# Patient Record
Sex: Male | Born: 1950 | Race: White | Hispanic: No | Marital: Married | Smoking: Former smoker
Health system: Southern US, Community
[De-identification: ages and names within clinical notes are randomized; demographics above are authoritative.]

## PROBLEM LIST (undated history)

## (undated) DIAGNOSIS — I499 Cardiac arrhythmia, unspecified: Secondary | ICD-10-CM

## (undated) DIAGNOSIS — N400 Enlarged prostate without lower urinary tract symptoms: Secondary | ICD-10-CM

## (undated) DIAGNOSIS — E785 Hyperlipidemia, unspecified: Secondary | ICD-10-CM

## (undated) DIAGNOSIS — N183 Chronic kidney disease, stage 3 unspecified: Secondary | ICD-10-CM

## (undated) DIAGNOSIS — Z86010 Personal history of colonic polyps: Secondary | ICD-10-CM

## (undated) DIAGNOSIS — I10 Essential (primary) hypertension: Secondary | ICD-10-CM

## (undated) HISTORY — PX: TONSILLECTOMY AND ADENOIDECTOMY: SUR1326

## (undated) HISTORY — PX: COLONOSCOPY: SHX174

---

## 1998-08-10 ENCOUNTER — Encounter: Admission: RE | Admit: 1998-08-10 | Discharge: 1998-08-10 | Payer: Self-pay | Admitting: *Deleted

## 2007-07-12 ENCOUNTER — Ambulatory Visit: Payer: Self-pay | Admitting: Gastroenterology

## 2010-03-24 ENCOUNTER — Emergency Department: Payer: Self-pay | Admitting: Emergency Medicine

## 2010-08-12 ENCOUNTER — Ambulatory Visit: Payer: Self-pay | Admitting: Gastroenterology

## 2019-05-29 ENCOUNTER — Inpatient Hospital Stay
Admission: EM | Admit: 2019-05-29 | Discharge: 2019-05-31 | DRG: 193 | Disposition: A | Discharge status: Home Health | Payer: Medicare Other | Attending: Internal Medicine | Admitting: Internal Medicine

## 2019-05-29 ENCOUNTER — Encounter: Payer: Self-pay | Admitting: Emergency Medicine

## 2019-05-29 ENCOUNTER — Emergency Department: Payer: Medicare Other

## 2019-05-29 ENCOUNTER — Inpatient Hospital Stay: Payer: Medicare Other

## 2019-05-29 ENCOUNTER — Other Ambulatory Visit: Payer: Self-pay

## 2019-05-29 DIAGNOSIS — Z87891 Personal history of nicotine dependence: Secondary | ICD-10-CM | POA: Diagnosis not present

## 2019-05-29 DIAGNOSIS — N400 Enlarged prostate without lower urinary tract symptoms: Secondary | ICD-10-CM | POA: Diagnosis present

## 2019-05-29 DIAGNOSIS — E876 Hypokalemia: Secondary | ICD-10-CM | POA: Diagnosis present

## 2019-05-29 DIAGNOSIS — N179 Acute kidney failure, unspecified: Secondary | ICD-10-CM | POA: Diagnosis present

## 2019-05-29 DIAGNOSIS — I248 Other forms of acute ischemic heart disease: Secondary | ICD-10-CM | POA: Diagnosis present

## 2019-05-29 DIAGNOSIS — E785 Hyperlipidemia, unspecified: Secondary | ICD-10-CM | POA: Diagnosis present

## 2019-05-29 DIAGNOSIS — I4891 Unspecified atrial fibrillation: Secondary | ICD-10-CM | POA: Diagnosis not present

## 2019-05-29 DIAGNOSIS — J181 Lobar pneumonia, unspecified organism: Principal | ICD-10-CM | POA: Diagnosis present

## 2019-05-29 DIAGNOSIS — J9601 Acute respiratory failure with hypoxia: Secondary | ICD-10-CM | POA: Diagnosis present

## 2019-05-29 DIAGNOSIS — I480 Paroxysmal atrial fibrillation: Secondary | ICD-10-CM | POA: Diagnosis not present

## 2019-05-29 DIAGNOSIS — R000 Tachycardia, unspecified: Secondary | ICD-10-CM

## 2019-05-29 DIAGNOSIS — I700 Atherosclerosis of aorta: Secondary | ICD-10-CM | POA: Diagnosis present

## 2019-05-29 DIAGNOSIS — Z79899 Other long term (current) drug therapy: Secondary | ICD-10-CM

## 2019-05-29 DIAGNOSIS — R0602 Shortness of breath: Secondary | ICD-10-CM | POA: Diagnosis not present

## 2019-05-29 DIAGNOSIS — Z7901 Long term (current) use of anticoagulants: Secondary | ICD-10-CM | POA: Diagnosis not present

## 2019-05-29 DIAGNOSIS — Z20828 Contact with and (suspected) exposure to other viral communicable diseases: Secondary | ICD-10-CM | POA: Diagnosis present

## 2019-05-29 DIAGNOSIS — Z8249 Family history of ischemic heart disease and other diseases of the circulatory system: Secondary | ICD-10-CM | POA: Diagnosis not present

## 2019-05-29 DIAGNOSIS — N183 Chronic kidney disease, stage 3 (moderate): Secondary | ICD-10-CM | POA: Diagnosis present

## 2019-05-29 DIAGNOSIS — J189 Pneumonia, unspecified organism: Secondary | ICD-10-CM

## 2019-05-29 DIAGNOSIS — I2510 Atherosclerotic heart disease of native coronary artery without angina pectoris: Secondary | ICD-10-CM | POA: Diagnosis present

## 2019-05-29 DIAGNOSIS — I129 Hypertensive chronic kidney disease with stage 1 through stage 4 chronic kidney disease, or unspecified chronic kidney disease: Secondary | ICD-10-CM | POA: Diagnosis present

## 2019-05-29 HISTORY — DX: Essential (primary) hypertension: I10

## 2019-05-29 HISTORY — DX: Chronic kidney disease, stage 3 unspecified: N18.30

## 2019-05-29 HISTORY — DX: Hyperlipidemia, unspecified: E78.5

## 2019-05-29 HISTORY — DX: Benign prostatic hyperplasia without lower urinary tract symptoms: N40.0

## 2019-05-29 LAB — COMPREHENSIVE METABOLIC PANEL
ALT: 61 U/L — ABNORMAL HIGH (ref 0–44)
AST: 67 U/L — ABNORMAL HIGH (ref 15–41)
Albumin: 3 g/dL — ABNORMAL LOW (ref 3.5–5.0)
Alkaline Phosphatase: 117 U/L (ref 38–126)
Anion gap: 11 (ref 5–15)
BUN: 27 mg/dL — ABNORMAL HIGH (ref 8–23)
CO2: 22 mmol/L (ref 22–32)
Calcium: 8.9 mg/dL (ref 8.9–10.3)
Chloride: 103 mmol/L (ref 98–111)
Creatinine, Ser: 1.79 mg/dL — ABNORMAL HIGH (ref 0.61–1.24)
GFR calc Af Amer: 44 mL/min — ABNORMAL LOW (ref >60)
GFR calc non Af Amer: 38 mL/min — ABNORMAL LOW (ref >60)
Glucose, Bld: 108 mg/dL — ABNORMAL HIGH (ref 70–99)
Potassium: 3.6 mmol/L (ref 3.5–5.1)
Sodium: 136 mmol/L (ref 135–145)
Total Bilirubin: 1.2 mg/dL (ref 0.3–1.2)
Total Protein: 7.5 g/dL (ref 6.5–8.1)

## 2019-05-29 LAB — CBC
HCT: 36.4 % — ABNORMAL LOW (ref 39.0–52.0)
Hemoglobin: 12.5 g/dL — ABNORMAL LOW (ref 13.0–17.0)
MCH: 30 pg (ref 26.0–34.0)
MCHC: 34.3 g/dL (ref 30.0–36.0)
MCV: 87.5 fL (ref 80.0–100.0)
Platelets: 249 10*3/uL (ref 150–400)
RBC: 4.16 MIL/uL — ABNORMAL LOW (ref 4.22–5.81)
RDW: 13.5 % (ref 11.5–15.5)
WBC: 9.7 10*3/uL (ref 4.0–10.5)
nRBC: 0 % (ref 0.0–0.2)

## 2019-05-29 LAB — LACTIC ACID, PLASMA
Lactic Acid, Venous: 1.7 mmol/L (ref 0.5–1.9)
Lactic Acid, Venous: 3.1 mmol/L (ref 0.5–1.9)
Lactic Acid, Venous: 1.4 mmol/L (ref 0.5–1.9)
Lactic Acid, Venous: 1.9 mmol/L (ref 0.5–1.9)

## 2019-05-29 LAB — PROCALCITONIN: Procalcitonin: 3.03 ng/mL

## 2019-05-29 LAB — PROTIME-INR
INR: 1.4 — ABNORMAL HIGH (ref 0.8–1.2)
Prothrombin Time: 16.5 seconds — ABNORMAL HIGH (ref 11.4–15.2)

## 2019-05-29 LAB — TSH: TSH: 2.045 u[IU]/mL (ref 0.350–4.500)

## 2019-05-29 LAB — MRSA PCR SCREENING: MRSA by PCR: NEGATIVE

## 2019-05-29 LAB — SARS CORONAVIRUS 2 BY RT PCR (HOSPITAL ORDER, PERFORMED IN ~~LOC~~ HOSPITAL LAB): SARS Coronavirus 2: NEGATIVE

## 2019-05-29 MED ORDER — GUAIFENESIN ER 600 MG PO TB12
600.0000 mg | ORAL_TABLET | Freq: Two times a day (BID) | ORAL | Status: DC
  Administered 2019-05-29 – 2019-05-31 (×5): 600 mg via ORAL
  Filled 2019-05-29 (×5): qty 1

## 2019-05-29 MED ORDER — ONDANSETRON HCL 4 MG PO TABS: 4.0000 mg | ORAL_TABLET | Freq: Four times a day (QID) | ORAL | Status: DC | PRN

## 2019-05-29 MED ORDER — PRAVASTATIN SODIUM 40 MG PO TABS
40.0000 mg | ORAL_TABLET | Freq: Every evening | ORAL | Status: DC
  Filled 2019-05-29: qty 2

## 2019-05-29 MED ORDER — ONDANSETRON HCL 4 MG/2ML IJ SOLN: 4.0000 mg | Freq: Four times a day (QID) | INTRAMUSCULAR | Status: DC | PRN

## 2019-05-29 MED ORDER — SODIUM CHLORIDE 0.9 % IV SOLN
INTRAVENOUS | Status: DC
  Administered 2019-05-29 – 2019-05-30 (×3): via INTRAVENOUS

## 2019-05-29 MED ORDER — POLYETHYLENE GLYCOL 3350 17 G PO PACK: 17.0000 g | PACK | Freq: Every day | ORAL | Status: DC | PRN

## 2019-05-29 MED ORDER — DILTIAZEM HCL-DEXTROSE 100-5 MG/100ML-% IV SOLN (PREMIX)
5.0000 mg/h | INTRAVENOUS | Status: DC
  Administered 2019-05-30: 5 mg/h via INTRAVENOUS
  Filled 2019-05-29 (×2): qty 100

## 2019-05-29 MED ORDER — DILTIAZEM HCL-DEXTROSE 100-5 MG/100ML-% IV SOLN (PREMIX)
5.0000 mg/h | INTRAVENOUS | Status: DC
  Filled 2019-05-29: qty 100

## 2019-05-29 MED ORDER — ACETAMINOPHEN 325 MG PO TABS
650.0000 mg | ORAL_TABLET | Freq: Four times a day (QID) | ORAL | Status: DC | PRN
  Administered 2019-05-29 (×2): 650 mg via ORAL
  Filled 2019-05-29 (×2): qty 2

## 2019-05-29 MED ORDER — LEVOFLOXACIN IN D5W 750 MG/150ML IV SOLN
750.0000 mg | Freq: Once | INTRAVENOUS | Status: AC
  Administered 2019-05-29: 750 mg via INTRAVENOUS
  Filled 2019-05-29: qty 150

## 2019-05-29 MED ORDER — METOPROLOL TARTRATE 5 MG/5ML IV SOLN
INTRAVENOUS | Status: AC
  Administered 2019-05-29: 5 mg via INTRAVENOUS
  Filled 2019-05-29: qty 5

## 2019-05-29 MED ORDER — DOCUSATE SODIUM 100 MG PO CAPS
100.0000 mg | ORAL_CAPSULE | Freq: Two times a day (BID) | ORAL | Status: DC
  Administered 2019-05-29 – 2019-05-30 (×3): 100 mg via ORAL
  Filled 2019-05-29 (×5): qty 1

## 2019-05-29 MED ORDER — TAMSULOSIN HCL 0.4 MG PO CAPS
0.4000 mg | ORAL_CAPSULE | Freq: Every day | ORAL | Status: DC
  Administered 2019-05-29 – 2019-05-31 (×3): 0.4 mg via ORAL
  Filled 2019-05-29 (×3): qty 1

## 2019-05-29 MED ORDER — METOPROLOL TARTRATE 5 MG/5ML IV SOLN
5.0000 mg | INTRAVENOUS | Status: AC
  Administered 2019-05-29: 5 mg via INTRAVENOUS

## 2019-05-29 MED ORDER — ACETAMINOPHEN 650 MG RE SUPP: 650.0000 mg | Freq: Four times a day (QID) | RECTAL | Status: DC | PRN

## 2019-05-29 MED ORDER — SODIUM CHLORIDE 0.9 % IV BOLUS
1000.0000 mL | Freq: Once | INTRAVENOUS | Status: AC
  Administered 2019-05-29: 1000 mL via INTRAVENOUS

## 2019-05-29 MED ORDER — SODIUM CHLORIDE 0.9 % IV SOLN
2.0000 g | Freq: Two times a day (BID) | INTRAVENOUS | Status: DC
  Administered 2019-05-29 – 2019-05-31 (×4): 2 g via INTRAVENOUS
  Filled 2019-05-29 (×6): qty 2

## 2019-05-29 MED ORDER — ENOXAPARIN SODIUM 40 MG/0.4ML ~~LOC~~ SOLN
40.0000 mg | SUBCUTANEOUS | Status: DC
  Administered 2019-05-29: 40 mg via SUBCUTANEOUS
  Filled 2019-05-29: qty 0.4

## 2019-05-29 NOTE — Consult Note (Addendum)
Pharmacy Antibiotic Note  Travis Franklin is a 68 y.o. male admitted on 05/29/2019 with pneumonia.  Pharmacy has been consulted for cefepime dosing. He presents to hospital secondary to worsening shortness of breath over the last 2 to 3 days in spite of Augmentin. His Scr (1.79 mg/dL) is elevated slightly above his baseline of approximately 1.5 mg/dL  Plan:  Start 2 grams IV cefepime every 12 hours   Height: 5\' 10"  (177.8 cm) Weight: 176 lb 1.6 oz (79.9 kg) IBW/kg (Calculated) : 73  Temp (24hrs), Avg:98.8 F (37.1 C), Min:98.1 F (36.7 C), Max:99.4 F (37.4 C)  Recent Labs  Lab 05/29/19 0850 05/29/19 1015 05/29/19 1315  WBC 9.7  --   --   CREATININE 1.79*  --   --   LATICACIDVEN  --  1.7 3.1*    Estimated Creatinine Clearance: 41.3 mL/min (A) (by C-G formula based on SCr of 1.79 mg/dL (H)).    No Known Allergies  Antimicrobials this admission:  cefepime 7/5 >>  levofloxacin 7/5 x1  Microbiology results: 7/5 BCx: pending 7/5 MRSA PCR: negative  Thank you for allowing pharmacy to be a part of this patient's care.  Dallie Piles, PharmD 05/29/2019 2:27 PM

## 2019-05-29 NOTE — H&P (Signed)
[facility] -  at [facility]   PATIENT NAME: Travis ProudJoseph Franklin    MR#:  [id number]  DATE OF BIRTH:  12-Dec-1950  DATE OF ADMISSION:  05/29/2019  PRIMARY CARE PHYSICIAN: Danella PentonMiller, Mark F, MD   REQUESTING/REFERRING PHYSICIAN: Dr. Sharyn CreamerMark Quale  CHIEF COMPLAINT:   Chief Complaint  Patient presents with   Shortness of Breath   Fever    HISTORY OF PRESENT ILLNESS:  Travis Franklin  is a 68 y.o. male with a known history of hypertension, CKD stage III, hyperlipidemia presents to hospital secondary to worsening shortness of breath over the last 2 to 3 days. Patient lives at home with his wife, active and independent at baseline.  He has been having low-grade fevers for the last 3 to 4 days associated with cough and shortness of breath.  He has seen his PCP about 3 days ago for the same symptoms.  COVID-19 test at the time was negative.  He was sent on Augmentin.  He acknowledges that he has been outside without a mask a few times and has been attending church and there have been situations with a little overcrowding of the place 2.  Denies any other sick contacts.  No recent travel. Complains of some orthopnea on and off in the last couple of days.  No chest pain or palpitations.  He has taken 3 days of oral Augmentin with worsening shortness of breath this morning and so presented to the emergency room.  He developed white count is within normal limits.  Procalcitonin is elevated but chest x-ray showing significant left lower lobe infiltrate.  Repeat COVID-19 test on admission is negative as well  PAST MEDICAL HISTORY:   Past Medical History:  Diagnosis Date   BPH (benign prostatic hyperplasia)    CKD (chronic kidney disease), stage III (HCC)    baseline cr 1.5   Hyperlipidemia    Hypertension     PAST SURGICAL HISTORY:   Past Surgical History:  Procedure Laterality Date   COLONOSCOPY     TONSILLECTOMY AND ADENOIDECTOMY      SOCIAL HISTORY:   Social  History   Tobacco Use   Smoking status: Former Smoker   Smokeless tobacco: Never Used   Tobacco comment: quit 30 years ago  Substance Use Topics   Alcohol use: Never    Frequency: Never    FAMILY HISTORY:   Family History  Problem Relation Age of Onset   Hypertension Mother     DRUG ALLERGIES:  No Known Allergies  REVIEW OF SYSTEMS:   Review of Systems  Constitutional: Positive for fever and malaise/fatigue. Negative for chills and weight loss.  HENT: Negative for ear discharge, ear pain, hearing loss and nosebleeds.   Eyes: Negative for blurred vision, double vision and photophobia.  Respiratory: Positive for cough and shortness of breath. Negative for hemoptysis and wheezing.   Cardiovascular: Positive for orthopnea. Negative for chest pain, palpitations and leg swelling.  Gastrointestinal: Negative for abdominal pain, constipation, diarrhea, heartburn, melena, nausea and vomiting.  Genitourinary: Negative for dysuria, frequency and urgency.  Musculoskeletal: Negative for back pain, myalgias and neck pain.  Skin: Negative for rash.  Neurological: Negative for dizziness, tingling, sensory change, speech change, focal weakness and headaches.  Endo/Heme/Allergies: Does not bruise/bleed easily.  Psychiatric/Behavioral: Negative for depression.    MEDICATIONS AT HOME:   Prior to Admission medications   Medication Sig Start Date End Date Taking? Authorizing Provider  amoxicillin-clavulanate (AUGMENTIN) 875-125 MG tablet Take 1 tablet by mouth  every 12 (twelve) hours. 05/26/19 06/05/19 Yes [provider]  ELDERBERRY PO Take 1 tablet by mouth daily.   Yes [provider]  loratadine (CLARITIN) 10 MG tablet Take 10 mg by mouth daily.   Yes [provider]  meloxicam (MOBIC) 7.5 MG tablet Take 7.5 mg by mouth daily as needed for pain. 03/17/19  Yes [provider]  pravastatin (PRAVACHOL) 40 MG tablet Take 40 mg by mouth every evening.  03/28/19  Yes [provider]  tamsulosin (FLOMAX) 0.4 MG CAPS capsule TK ONE C PO ONCE D. TK 30 MIN AFTER SAME MEAL EACH DAY 05/02/19  Yes [provider]  thiamine (VITAMIN B-1) 100 MG tablet Take 100 mg by mouth daily.   Yes [provider]  TURMERIC PO Take 1 tablet by mouth daily.   Yes [provider]      VITAL SIGNS:  Blood pressure (!) 166/87, pulse (!) 112, temperature 98.1 F (36.7 C), temperature source Oral, resp. rate (!) 22, height 5\' 10"  (1.778 m), weight 77.1 kg, SpO2 97 %.  PHYSICAL EXAMINATION:  Physical Exam  GENERAL:  68 y.o.-year-old patient lying in the bed with no acute distress.  EYES: Pupils equal, round, reactive to light and accommodation. No scleral icterus. Extraocular muscles intact.  HEENT: Head atraumatic, normocephalic. Oropharynx and nasopharynx clear.  NECK:  Supple, no jugular venous distention. No thyroid enlargement, no tenderness.  LUNGS: Significantly decreased left mid and lower lobe breath sounds, no wheezing, rales,rhonchi or crepitation.  Otherwise has good air entry on the right lung and left upper lobes.  No use of accessory muscles of respiration.  CARDIOVASCULAR: S1, S2 normal. No  rubs, or gallops.  2/6 systolic murmur is present ABDOMEN: Soft, nontender, nondistended. Bowel sounds present. No organomegaly or mass.  EXTREMITIES: No pedal edema, cyanosis, or clubbing.  NEUROLOGIC: Cranial nerves II through XII are intact. Muscle strength 5/5 in all extremities. Sensation intact. Gait not checked.  PSYCHIATRIC: The patient is alert and oriented x 3.  SKIN: No obvious rash, lesion, or ulcer.   LABORATORY PANEL:   CBC Recent Labs  Lab 05/29/19 0850  WBC 9.7  HGB 12.5*  HCT 36.4*  PLT 249   ------------------------------------------------------------------------------------------------------------------  Chemistries  Recent Labs  Lab 05/29/19 0850  NA 136  K 3.6  CL 103  CO2 22  GLUCOSE 108*    BUN 27*  CREATININE 1.79*  CALCIUM 8.9  AST 67*  ALT 61*  ALKPHOS 117  BILITOT 1.2   ------------------------------------------------------------------------------------------------------------------  Cardiac Enzymes No results for input(s): TROPONINI in the last 168 hours. ------------------------------------------------------------------------------------------------------------------  RADIOLOGY:  Ct Chest Wo Contrast  Result Date: 05/29/2019 CLINICAL DATA:  Shortness of breath.  Abnormal chest x-ray. EXAM: CT CHEST WITHOUT CONTRAST TECHNIQUE: Multidetector CT imaging of the chest was performed following the standard protocol without IV contrast. COMPARISON:  Chest x-ray earlier today. FINDINGS: Cardiovascular: The heart is normal in size. No pericardial effusion. There is tortuosity, mild ectasia and mild calcification of the thoracic aorta. Branch vessel ostial calcifications are noted along with fairly extensive three-vessel coronary artery calcifications. Mediastinum/Nodes: Scattered mediastinal and hilar lymph nodes likely inflammatory/hyperplastic. No mass or overt adenopathy. The esophagus is grossly normal. There is a small hiatal hernia noted. Lungs/Pleura: Extensive airspace consolidation and air bronchograms throughout the entire left lower lobe consistent with lobar pneumonia. The left upper lobe and lingula are clear. There is a small associated parapneumonic effusion. The right lung is clear.  No infiltrates or effusions. No worrisome  pulmonary lesions. Upper Abdomen: No significant upper abdominal findings. Simple left hepatic lobe cyst noted. Moderate atherosclerotic calcifications involving the upper abdominal aorta. Musculoskeletal: No significant bony findings. IMPRESSION: 1. Lobar pneumonia left lower lobe.  Small parapneumonic effusion. 2. Scattered mediastinal and hilar lymph nodes likely hyperplastic/inflammatory. 3. The right lung is clear.  No worrisome pulmonary lesions.  4. Age advanced and fairly extensive three-vessel coronary artery calcifications Aortic Atherosclerosis (ICD10-I70.0). Electronically Signed   By: Rudie MeyerP.  Gallerani M.D.   On: 05/29/2019 12:02   Dg Chest Portable 1 View  Result Date: 05/29/2019 CLINICAL DATA:  Fever, chills and shortness of breath. EXAM: PORTABLE CHEST 1 VIEW COMPARISON:  None. FINDINGS: The cardiac silhouette, mediastinal and hilar contours are within normal limits. Extensive left lung airspace process with airspace consolidation and air bronchograms. Findings consistent with pneumonia. Suspect associated parapneumonic effusion. The right lung is clear. The bony thorax is intact. IMPRESSION: Extensive left-sided pneumonia. Followup PA and lateral chest X-ray is recommended in 3-4 weeks following trial of antibiotic therapy to ensure resolution and exclude underlying malignancy. Electronically Signed   By: Rudie MeyerP.  Gallerani M.D.   On: 05/29/2019 09:33      IMPRESSION AND PLAN:   Travis Franklin  is a 68 y.o. male with a known history of hypertension, CKD stage III, hyperlipidemia presents to hospital secondary to worsening shortness of breath over the last 2 to 3 days.  1.  Community-acquired pneumonia-failed outpatient antibiotic therapy with Augmentin -Admit, blood cultures and sputum cultures -CT chest confirming left lower lobe pneumonia -Check MRSA swab, is currently on vancomycin and cefepime -Check echocardiogram as well given orthopnea  2.  Acute renal insufficiency on CKD stage III-Baseline creatinine around 1.5, currently elevated to 1.79 given infection and possibly ATN. -Gentle hydration and monitor labs.  3.  Hyperlipidemia-continue Pravachol  4.  BPH-on Flomax  5.  DVT prophylaxis-Lovenox  Physical therapy consult requested Updated wife over the phone after admission today.   All the records are reviewed and case discussed with ED provider. Management plans discussed with the patient, family and they are in  agreement.  CODE STATUS: Full Code  TOTAL TIME TAKING CARE OF THIS PATIENT: 52 minutes.    Enid Baasadhika Magda Muise M.D on 05/29/2019 at 12:30 PM  Between 7am to 6pm - Pager - [phone]  After 6pm go to [email or web address] - Social research officer, governmentpassword [email or web address]  [facility]  Office  [phone]  CC: Primary care physician; Danella PentonMiller, Mark F, MD   Note: This dictation was prepared with Dragon dictation along with smaller phrase technology. Any transcriptional errors that result from this process are unintentional.

## 2019-05-29 NOTE — ED Notes (Signed)
Pt states he was having some SHOB today and states his neighbor told him he needed to come to the hospital- states he has a small cough

## 2019-05-29 NOTE — ED Notes (Signed)
Report given to Anna, RN 

## 2019-05-29 NOTE — Progress Notes (Signed)
PT Cancellation Note  Patient Details Name: Travis Franklin MRN: [id number] DOB: 04-19-1951   Cancelled Treatment:    Reason Eval/Treat Not Completed: Patient not medically ready Pt has been having sustained elevated HR 120s, per nursing with moving in bed or getting up to go to the bathroom HR spikes to 150s.  Will hold PT today and assess tomorrow.  Kreg Shropshire, DPT 05/29/2019, 4:35 PM

## 2019-05-29 NOTE — ED Provider Notes (Signed)
[facility] Emergency Department Provider Note   ____________________________________________   First MD Initiated Contact with Patient 05/29/19 [phone]     (approximate)  I have reviewed the triage vital signs and the nursing notes.   HISTORY  Chief Complaint Shortness of Breath and Fever    HPI Travis Franklin is a 68 y.o. male for evaluation of shortness of breath   The patient has been feeling some chills, fatigue and shortness of breath over the last few days but started to feel more short of breath today.  He got seen and tested negative for COVID recently but reports he started to develop shortness of breath now so leading him to come for evaluation  No chest pain.  No nausea or vomiting.  Continue to eat and drink okay.  Feels like he fatigues easily with exertion.  Denies recent antibiotic treatments or hospitalization.  Saw primary care doctor for fever 100.3 couple days ago and was prescribed Augmentin  Past Medical History:  Diagnosis Date  . Renal disorder     Patient Active Problem List   Diagnosis Date Noted  . Pneumonia 05/29/2019    History reviewed. No pertinent surgical history.  Prior to Admission medications   Medication Sig Start Date End Date Taking? Authorizing Provider  amoxicillin-clavulanate (AUGMENTIN) 875-125 MG tablet Take 1 tablet by mouth every 12 (twelve) hours. 05/26/19 06/05/19 Yes [provider]  ELDERBERRY PO Take 1 tablet by mouth daily.   Yes [provider]  loratadine (CLARITIN) 10 MG tablet Take 10 mg by mouth daily.   Yes [provider]  meloxicam (MOBIC) 7.5 MG tablet Take 7.5 mg by mouth daily as needed for pain. 03/17/19  Yes [provider]  pravastatin (PRAVACHOL) 40 MG tablet Take 40 mg by mouth every evening. 03/28/19  Yes [provider]  tamsulosin (FLOMAX) 0.4 MG CAPS capsule TK ONE C PO ONCE D. TK 30 MIN AFTER SAME MEAL EACH DAY 05/02/19  Yes [provider]  thiamine (VITAMIN B-1) 100 MG tablet Take 100 mg by mouth daily.   Yes [provider]  TURMERIC PO Take 1 tablet by mouth daily.   Yes [provider]    Allergies Patient has no known allergies.  History reviewed. No pertinent family history.  Social History Social History   Tobacco Use  . Smoking status: Never Smoker  . Smokeless tobacco: Never Used  Substance Use Topics  . Alcohol use: Not on file  . Drug use: Not on file  No illicit drug use  Review of Systems Constitutional: See HPI Eyes: No visual changes. ENT: No sore throat. Cardiovascular: Denies chest pain. Respiratory: See HPI.  Some sputum production with cough Gastrointestinal: No abdominal pain.   Genitourinary: Negative for dysuria. Musculoskeletal: Negative for back pain. Skin: Negative for rash. Neurological: Negative for headaches, areas of focal weakness or numbness.    ____________________________________________   PHYSICAL EXAM:  VITAL SIGNS: ED Triage Vitals  Enc Vitals Group     BP 05/29/19 0834 134/63     Pulse Rate 05/29/19 0834 (!) 123     Resp 05/29/19 0834 18     Temp 05/29/19 0834 98.1 F (36.7 C)     Temp Source 05/29/19 0834 Oral     SpO2 05/29/19 0834 95 %     Weight 05/29/19 0857 170 lb (77.1 kg)     Height 05/29/19 0857 5\' 10"  (1.778 m)     Head Circumference --  Peak Flow --      Pain Score 05/29/19 0857 2     Pain Loc --      Pain Edu? --      Excl. in [place]? --     Constitutional: Alert and oriented. Well appearing and in no acute distress. Eyes: Conjunctivae are normal. Head: Atraumatic. Nose: No congestion/rhinnorhea. Mouth/Throat: Mucous membranes are moist. Neck: No stridor.  Cardiovascular: Tachycardic rate, regular rhythm. Grossly normal heart sounds.  Good peripheral circulation. Respiratory: Normal respiratory effort.  No retractions. Lungs CTAB.  Moderately diminished lung sounds in the left side.  No wheezing.  Gastrointestinal: Soft and nontender. No distention. Musculoskeletal: No lower extremity tenderness nor edema. Neurologic:  Normal speech and language. No gross focal neurologic deficits are appreciated.  Skin:  Skin is warm, dry and intact. No rash noted. Psychiatric: Mood and affect are normal. Speech and behavior are normal.  ____________________________________________   LABS (all labs ordered are listed, but only abnormal results are displayed)  Labs Reviewed  CBC - Abnormal; Notable for the following components:      Result Value   RBC 4.16 (*)    Hemoglobin 12.5 (*)    HCT 36.4 (*)    All other components within normal limits  COMPREHENSIVE METABOLIC PANEL - Abnormal; Notable for the following components:   Glucose, Bld 108 (*)    BUN 27 (*)    Creatinine, Ser 1.79 (*)    Albumin 3.0 (*)    AST 67 (*)    ALT 61 (*)    GFR calc non Af Amer 38 (*)    GFR calc Af Amer 44 (*)    All other components within normal limits  PROTIME-INR - Abnormal; Notable for the following components:   Prothrombin Time 16.5 (*)    INR 1.4 (*)    All other components within normal limits  SARS CORONAVIRUS 2 (HOSPITAL ORDER, [place] LAB)  CULTURE, BLOOD (ROUTINE X 2)  CULTURE, BLOOD (ROUTINE X 2)  MRSA PCR SCREENING  LACTIC ACID, PLASMA  PROCALCITONIN  LACTIC ACID, PLASMA   ____________________________________________  EKG  Reviewed entered by me at 840 Heart rate 125 QRS 90 QTc 430 Sinus tachycardia, no evidence of acute ischemia ____________________________________________  RADIOLOGY  Ct Chest Wo Contrast  Result Date: 05/29/2019 CLINICAL DATA:  Shortness of breath.  Abnormal chest x-ray. EXAM: CT CHEST WITHOUT CONTRAST TECHNIQUE: Multidetector CT imaging of the chest was performed following the standard protocol without IV contrast. COMPARISON:  Chest x-ray earlier today. FINDINGS: Cardiovascular: The heart is normal in size. No pericardial effusion.  There is tortuosity, mild ectasia and mild calcification of the thoracic aorta. Branch vessel ostial calcifications are noted along with fairly extensive three-vessel coronary artery calcifications. Mediastinum/Nodes: Scattered mediastinal and hilar lymph nodes likely inflammatory/hyperplastic. No mass or overt adenopathy. The esophagus is grossly normal. There is a small hiatal hernia noted. Lungs/Pleura: Extensive airspace consolidation and air bronchograms throughout the entire left lower lobe consistent with lobar pneumonia. The left upper lobe and lingula are clear. There is a small associated parapneumonic effusion. The right lung is clear.  No infiltrates or effusions. No worrisome pulmonary lesions. Upper Abdomen: No significant upper abdominal findings. Simple left hepatic lobe cyst noted. Moderate atherosclerotic calcifications involving the upper abdominal aorta. Musculoskeletal: No significant bony findings. IMPRESSION: 1. Lobar pneumonia left lower lobe.  Small parapneumonic effusion. 2. Scattered mediastinal and hilar lymph nodes likely hyperplastic/inflammatory. 3. The right lung is clear.  No worrisome pulmonary  lesions. 4. Age advanced and fairly extensive three-vessel coronary artery calcifications Aortic Atherosclerosis (ICD10-I70.0). Electronically Signed   By: Rudie MeyerP.  Gallerani M.D.   On: 05/29/2019 12:02   Dg Chest Portable 1 View  Result Date: 05/29/2019 CLINICAL DATA:  Fever, chills and shortness of breath. EXAM: PORTABLE CHEST 1 VIEW COMPARISON:  None. FINDINGS: The cardiac silhouette, mediastinal and hilar contours are within normal limits. Extensive left lung airspace process with airspace consolidation and air bronchograms. Findings consistent with pneumonia. Suspect associated parapneumonic effusion. The right lung is clear. The bony thorax is intact. IMPRESSION: Extensive left-sided pneumonia. Followup PA and lateral chest X-ray is recommended in 3-4 weeks following trial of antibiotic  therapy to ensure resolution and exclude underlying malignancy. Electronically Signed   By: Rudie MeyerP.  Gallerani M.D.   On: 05/29/2019 09:33    Chest x-ray reviewed, left-sided pneumonia suspected. ____________________________________________   PROCEDURES  Procedure(s) performed: None  Procedures  Critical Care performed: No  ____________________________________________   INITIAL IMPRESSION / ASSESSMENT AND PLAN / ED COURSE  Pertinent labs & imaging results that were available during my care of the patient were reviewed by me and considered in my medical decision making (see chart for details).   Patient presents for fevers cough sputum production and shortness of breath.  X-ray concerning for significant left-sided pneumonia, also concern for possible parapneumonic effusion.  He denies acute cardiac symptoms.  He is not hypoxic but is tachycardic.  Recently tested negative for COVID  The patient does not meet sepsis criteria, but given his age, risk factors, extensive pneumonia suspected on the left I have recommended admission and the patient is in agreement.  We will start on IV Levaquin, his QT is normal, and he denies any recent hospitalizations and has only been on a couple days of Augmentin.  Procalcitonin elevated as well.  No leg swelling.  No signs or symptoms suggest DVT or PE.  Will obtain noncontrast chest CT to evaluate further for etiology, further evaluate for mass, parapneumonic effusion etc.  Admit to the hospitalist service.  Clinical Course as of May 28 1206  Wynelle LinkSun May 29, 2019  1103 Admission discussed with hospitalist nurse practitioner Lanora ManisElizabeth.   [MQ]    Clinical Course User Index [MQ] Sharyn CreamerQuale, Elka Satterfield, MD   Travis Franklin was evaluated in Emergency Department on 05/29/2019 for the symptoms described in the history of present illness. He was evaluated in the context of the global COVID-19 pandemic, which necessitated consideration that the patient might be at risk  for infection with the SARS-CoV-2 virus that causes COVID-19. Institutional protocols and algorithms that pertain to the evaluation of patients at risk for COVID-19 are in a state of rapid change based on information released by regulatory bodies including the CDC and federal and state organizations. These policies and algorithms were followed during the patient's care in the ED.  Rapid COVID neg  Does not technically meet sepsis criteria.  ____________________________________________   FINAL CLINICAL IMPRESSION(S) / ED DIAGNOSES  Final diagnoses:  Community acquired pneumonia of left lung, unspecified part of lung        Note:  This document was prepared using Conservation officer, historic buildingsDragon voice recognition software and may include unintentional dictation errors       Sharyn CreamerQuale, Maricsa Sammons, MD 05/29/19 1207

## 2019-05-29 NOTE — Progress Notes (Signed)
   [place] [facility] Day: 0 days Travis Franklin is a 68 y.o. male presenting with Shortness of Breath and Fever .   Advance care planning discussed with patient at bedside.  Patient is alert and oriented and able to participate in this discussion. He has history of hypertension, BPH and active independent at home.  Admitting with community-acquired pneumonia, failed oral antibiotics as outpatient. -COVID-19 test is negative.  All questions in regards to overall condition and expected prognosis answered.  He has clearly expressed his wishes for full code. The decision was made to continue current code status  CODE STATUS: Full Code Time spent: 18 minutes

## 2019-05-29 NOTE — ED Notes (Signed)
ED TO INPATIENT HANDOFF REPORT  ED Nurse Name and Phone #:  Ariel [phone]  S Name/Age/Gender Travis NuttingJoseph L Franklin 68 y.o. male Room/Bed: ED01A/ED01A  Code Status   Code Status: Not on file  Home/SNF/Other Home Patient oriented to: self, place, time and situation Is this baseline? Yes   Triage Complete: Triage complete  Chief Complaint fever short of breath back pain aching all ov  Triage Note Pt arrived via POV with wife with reports of shortness of breath upon awakening today, pt recently tested for COVID that was negative. Pt c/o fever, chills and shortness of breath. Pt states the shortness of breath was worse with movement, but states his shortness of breath is improving.    Allergies No Known Allergies  Level of Care/Admitting Diagnosis ED Disposition    ED Disposition Condition Comment   Admit  [facility] [[id number]]  Level of Care: Med-Surg [16]  Covid Evaluation: N/A  Diagnosis: Pneumonia [227785]  Admitting Physician: Enid BaasKALISETTI, RADHIKA [[id number]]  Attending Physician: Enid BaasKALISETTI, RADHIKA [[id number]]  Estimated length of stay: past midnight tomorrow  Certification:: I certify this patient will need inpatient services for at least 2 midnights  PT Class (Do Not Modify): Inpatient [101]  PT Acc Code (Do Not Modify): Private [1]       B Medical/Surgery History Past Medical History:  Diagnosis Date  . Renal disorder    History reviewed. No pertinent surgical history.   A IV Location/Drains/Wounds Patient Lines/Drains/Airways Status   Active Line/Drains/Airways    Name:   Placement date:   Placement time:   Site:   Days:   Peripheral IV 05/29/19 Left Antecubital   05/29/19    1013    Antecubital   less than 1   Peripheral IV 05/29/19 Right Antecubital   05/29/19    1014    Antecubital   less than 1          Intake/Output Last 24 hours No intake or output data in the 24 hours ending 05/29/19 1147  Labs/Imaging Results for  orders placed or performed during the hospital encounter of 05/29/19 (from the past 48 hour(s))  CBC     Status: Abnormal   Collection Time: 05/29/19  8:50 AM  Result Value Ref Range   WBC 9.7 4.0 - 10.5 K/uL   RBC 4.16 (L) 4.22 - 5.81 MIL/uL   Hemoglobin 12.5 (L) 13.0 - 17.0 g/dL   HCT 41.336.4 (L) 24.439.0 - 01.052.0 %   MCV 87.5 80.0 - 100.0 fL   MCH 30.0 26.0 - 34.0 pg   MCHC 34.3 30.0 - 36.0 g/dL   RDW 27.213.5 53.611.5 - 64.415.5 %   Platelets 249 150 - 400 K/uL   nRBC 0.0 0.0 - 0.2 %    Comment: Performed at [facility], [address] [place], [place] [address]  Comprehensive metabolic panel     Status: Abnormal   Collection Time: 05/29/19  8:50 AM  Result Value Ref Range   Sodium 136 135 - 145 mmol/L   Potassium 3.6 3.5 - 5.1 mmol/L   Chloride 103 98 - 111 mmol/L   CO2 22 22 - 32 mmol/L   Glucose, Bld 108 (H) 70 - 99 mg/dL   BUN 27 (H) 8 - 23 mg/dL   Creatinine, Ser 2.591.79 (H) 0.61 - 1.24 mg/dL   Calcium 8.9 8.9 - 56.310.3 mg/dL   Total Protein 7.5 6.5 - 8.1 g/dL   Albumin 3.0 (L) 3.5 - 5.0 g/dL  AST 67 (H) 15 - 41 U/L   ALT 61 (H) 0 - 44 U/L   Alkaline Phosphatase 117 38 - 126 U/L   Total Bilirubin 1.2 0.3 - 1.2 mg/dL   GFR calc non Af Amer 38 (L) >60 mL/min   GFR calc Af Amer 44 (L) >60 mL/min   Anion gap 11 5 - 15    Comment: Performed at [facility], [address] [place], [place] [address]  Procalcitonin     Status: None   Collection Time: 05/29/19  8:50 AM  Result Value Ref Range   Procalcitonin 3.03 ng/mL    Comment:        Interpretation: PCT > 2 ng/mL: Systemic infection (sepsis) is likely, unless other causes are known. (NOTE)       Sepsis PCT Algorithm           Lower Respiratory Tract                                      Infection PCT Algorithm    ----------------------------     ----------------------------         PCT < 0.25 ng/mL                PCT < 0.10 ng/mL         Strongly encourage             Strongly discourage   discontinuation of  antibiotics    initiation of antibiotics    ----------------------------     -----------------------------       PCT 0.25 - 0.50 ng/mL            PCT 0.10 - 0.25 ng/mL               OR       >80% decrease in PCT            Discourage initiation of                                            antibiotics      Encourage discontinuation           of antibiotics    ----------------------------     -----------------------------         PCT >= 0.50 ng/mL              PCT 0.26 - 0.50 ng/mL               AND       <80% decrease in PCT              Encourage initiation of                                             antibiotics       Encourage continuation           of antibiotics    ----------------------------     -----------------------------        PCT >= 0.50 ng/mL                  PCT > 0.50 ng/mL  AND         increase in PCT                  Strongly encourage                                      initiation of antibiotics    Strongly encourage escalation           of antibiotics                                     -----------------------------                                           PCT <= 0.25 ng/mL                                                 OR                                        > 80% decrease in PCT                                     Discontinue / Do not initiate                                             antibiotics Performed at [facility], [address] [place], [place] [address]   Lactic acid, plasma     Status: None   Collection Time: 05/29/19 10:15 AM  Result Value Ref Range   Lactic Acid, Venous 1.7 0.5 - 1.9 mmol/L    Comment: Performed at [facility], [address] [place], [place] [address]  Protime-INR     Status: Abnormal   Collection Time: 05/29/19 10:15 AM  Result Value Ref Range   Prothrombin Time 16.5 (H) 11.4 - 15.2 seconds   INR 1.4 (H) 0.8 - 1.2    Comment: (NOTE) INR goal varies based on device and disease  states. Performed at [facility], [address], [place], [place] [address]   SARS Coronavirus 2 (CEPHEID - Performed in [facility] hospital lab), Hosp Order     Status: None   Collection Time: 05/29/19 10:22 AM   Specimen: Nasopharyngeal Swab  Result Value Ref Range   SARS Coronavirus 2 NEGATIVE NEGATIVE    Comment: (NOTE) If result is NEGATIVE SARS-CoV-2 target nucleic acids are NOT DETECTED. The SARS-CoV-2 RNA is generally detectable in upper and lower  respiratory specimens during the acute phase of infection. The lowest  concentration of SARS-CoV-2 viral copies this assay can detect is 250  copies / mL. A negative result does not preclude SARS-CoV-2 infection  and should not be used as the sole basis for treatment or other  patient management  decisions.  A negative result may occur with  improper specimen collection / handling, submission of specimen other  than nasopharyngeal swab, presence of viral mutation(s) within the  areas targeted by this assay, and inadequate number of viral copies  (<250 copies / mL). A negative result must be combined with clinical  observations, patient history, and epidemiological information. If result is POSITIVE SARS-CoV-2 target nucleic acids are DETECTED. The SARS-CoV-2 RNA is generally detectable in upper and lower  respiratory specimens dur ing the acute phase of infection.  Positive  results are indicative of active infection with SARS-CoV-2.  Clinical  correlation with patient history and other diagnostic information is  necessary to determine patient infection status.  Positive results do  not rule out bacterial infection or co-infection with other viruses. If result is PRESUMPTIVE POSTIVE SARS-CoV-2 nucleic acids MAY BE PRESENT.   A presumptive positive result was obtained on the submitted specimen  and confirmed on repeat testing.  While 2019 novel coronavirus  (SARS-CoV-2) nucleic acids may be present in the submitted  sample  additional confirmatory testing may be necessary for epidemiological  and / or clinical management purposes  to differentiate between  SARS-CoV-2 and other Sarbecovirus currently known to infect humans.  If clinically indicated additional testing with an alternate test  methodology [phone]) is advised. The SARS-CoV-2 RNA is generally  detectable in upper and lower respiratory sp ecimens during the acute  phase of infection. The expected result is Negative. Fact Sheet for Patients:  StrictlyIdeas.no Fact Sheet for Healthcare Providers: BankingDealers.co.za This test is not yet approved or cleared by the [place] FDA and has been authorized for detection and/or diagnosis of SARS-CoV-2 by FDA under an Emergency Use Authorization (EUA).  This EUA will remain in effect (meaning this test can be used) for the duration of the COVID-19 declaration under Section 564(b)(1) of the Act, 21 U.S.C. section 360bbb-3(b)(1), unless the authorization is terminated or revoked sooner. Performed at [facility], [address], [place], [place] [address]    Dg Chest Portable 1 View  Result Date: 05/29/2019 CLINICAL DATA:  Fever, chills and shortness of breath. EXAM: PORTABLE CHEST 1 VIEW COMPARISON:  None. FINDINGS: The cardiac silhouette, mediastinal and hilar contours are within normal limits. Extensive left lung airspace process with airspace consolidation and air bronchograms. Findings consistent with pneumonia. Suspect associated parapneumonic effusion. The right lung is clear. The bony thorax is intact. IMPRESSION: Extensive left-sided pneumonia. Followup PA and lateral chest X-ray is recommended in 3-4 weeks following trial of antibiotic therapy to ensure resolution and exclude underlying malignancy. Electronically Signed   By: Marijo Sanes M.D.   On: 05/29/2019 09:33    Pending Labs Unresulted Labs (From admission, onward)     Start     Ordered   05/29/19 1121  MRSA PCR Screening  Once,   STAT     05/29/19 1120   05/29/19 0925  Lactic acid, plasma  Now then every 2 hours,   STAT     05/29/19 0925   05/29/19 0925  Blood Culture (routine x 2)  BLOOD CULTURE X 2,   STAT     05/29/19 0925   Signed and Held  HIV antibody (Routine Testing)  Once,   R     Signed and Held   Signed and Held  TSH  Once,   R     Signed and Held   Signed and [employer] metabolic panel  Tomorrow morning,   R  Signed and Held   Signed and Held  CBC  Tomorrow morning,   R     Signed and Held          Vitals/Pain Today's Vitals   05/29/19 0919 05/29/19 0930 05/29/19 0931 05/29/19 1030  BP: 129/75 (!) 166/87    Pulse: (!) 113  (!) 118 (!) 112  Resp:    (!) 22  Temp:      TempSrc:      SpO2: 96%  95% 97%  Weight:      Height:      PainSc:        Isolation Precautions No active isolations  Medications Medications  levofloxacin (LEVAQUIN) IVPB 750 mg (750 mg Intravenous New Bag/Given 05/29/19 1117)  sodium chloride 0.9 % bolus 1,000 mL (1,000 mLs Intravenous New Bag/Given 05/29/19 1114)    Mobility walks Low fall risk   Focused Assessments    R Recommendations: See Admitting Provider Note  Report given to:   Additional Notes:

## 2019-05-29 NOTE — Progress Notes (Signed)
PHARMACY -  BRIEF ANTIBIOTIC NOTE   Pharmacy has received consult(s) for Levaquin from an ED provider.  The patient's profile has been reviewed for ht/wt/allergies/indication/available labs.    One time order(s) placed for Levaquin 750 mg IV  Further antibiotics/pharmacy consults should be ordered by admitting physician if indicated.                       Thank you, Blakelyn Dinges A  05/29/2019  10:51 AM

## 2019-05-29 NOTE — ED Triage Notes (Signed)
Pt arrived via POV with wife with reports of shortness of breath upon awakening today, pt recently tested for COVID that was negative. Pt c/o fever, chills and shortness of breath. Pt states the shortness of breath was worse with movement, but states his shortness of breath is improving.

## 2019-05-29 NOTE — ED Notes (Addendum)
FIRST NURSE NOTE:  Per wife, pt recently had COVID test that was NEGATIVE on 05/26/19

## 2019-05-29 NOTE — ED Notes (Signed)
Patient transported to CT 

## 2019-05-29 NOTE — Progress Notes (Signed)
Dr Tressia Miners notified of Lactic acid 3.1

## 2019-05-29 NOTE — Progress Notes (Signed)
Patient ST since beginning of the shift; heart rate increases to 140's to 160's when up to bathroom. Patient at this time converted to A-fib with heart rate sustaining in the 180's. New orders received and patient will be transferring to telemetry.

## 2019-05-29 NOTE — Progress Notes (Signed)
Patient resting in bed. IVF infusing, room air, bed alarm on, bed in lowest position. Call bell in reach. No complaints at this time. Continue to monitor.

## 2019-05-30 ENCOUNTER — Inpatient Hospital Stay (HOSPITAL_COMMUNITY)
Admit: 2019-05-30 | Discharge: 2019-05-30 | Disposition: A | Discharge status: Home / Self Care | Payer: Medicare Other | Attending: Internal Medicine | Admitting: Internal Medicine

## 2019-05-30 DIAGNOSIS — I4891 Unspecified atrial fibrillation: Secondary | ICD-10-CM

## 2019-05-30 DIAGNOSIS — R0602 Shortness of breath: Secondary | ICD-10-CM

## 2019-05-30 LAB — BASIC METABOLIC PANEL
Anion gap: 10 (ref 5–15)
Anion gap: 12 (ref 5–15)
BUN: 26 mg/dL — ABNORMAL HIGH (ref 8–23)
BUN: 28 mg/dL — ABNORMAL HIGH (ref 8–23)
CO2: 18 mmol/L — ABNORMAL LOW (ref 22–32)
CO2: 20 mmol/L — ABNORMAL LOW (ref 22–32)
Calcium: 8 mg/dL — ABNORMAL LOW (ref 8.9–10.3)
Calcium: 8.2 mg/dL — ABNORMAL LOW (ref 8.9–10.3)
Chloride: 106 mmol/L (ref 98–111)
Chloride: 107 mmol/L (ref 98–111)
Creatinine, Ser: 1.69 mg/dL — ABNORMAL HIGH (ref 0.61–1.24)
Creatinine, Ser: 1.72 mg/dL — ABNORMAL HIGH (ref 0.61–1.24)
GFR calc Af Amer: 47 mL/min — ABNORMAL LOW (ref >60)
GFR calc Af Amer: 48 mL/min — ABNORMAL LOW (ref >60)
GFR calc non Af Amer: 40 mL/min — ABNORMAL LOW (ref >60)
GFR calc non Af Amer: 41 mL/min — ABNORMAL LOW (ref >60)
Glucose, Bld: 114 mg/dL — ABNORMAL HIGH (ref 70–99)
Glucose, Bld: 117 mg/dL — ABNORMAL HIGH (ref 70–99)
Potassium: 3.1 mmol/L — ABNORMAL LOW (ref 3.5–5.1)
Potassium: 3.2 mmol/L — ABNORMAL LOW (ref 3.5–5.1)
Sodium: 136 mmol/L (ref 135–145)
Sodium: 137 mmol/L (ref 135–145)

## 2019-05-30 LAB — CBC
HCT: 34.4 % — ABNORMAL LOW (ref 39.0–52.0)
Hemoglobin: 11.7 g/dL — ABNORMAL LOW (ref 13.0–17.0)
MCH: 29.6 pg (ref 26.0–34.0)
MCHC: 34 g/dL (ref 30.0–36.0)
MCV: 87.1 fL (ref 80.0–100.0)
Platelets: 203 10*3/uL (ref 150–400)
RBC: 3.95 MIL/uL — ABNORMAL LOW (ref 4.22–5.81)
RDW: 13.5 % (ref 11.5–15.5)
WBC: 9.2 10*3/uL (ref 4.0–10.5)
nRBC: 0 % (ref 0.0–0.2)

## 2019-05-30 LAB — ECHOCARDIOGRAM COMPLETE
Height: 70 in
Weight: 2817.6 oz

## 2019-05-30 LAB — EXPECTORATED SPUTUM ASSESSMENT W GRAM STAIN, RFLX TO RESP C: Special Requests: NORMAL

## 2019-05-30 LAB — TROPONIN I (HIGH SENSITIVITY)
Troponin I (High Sensitivity): 22 ng/L — ABNORMAL HIGH (ref <18)
Troponin I (High Sensitivity): 34 ng/L — ABNORMAL HIGH (ref <18)

## 2019-05-30 LAB — MAGNESIUM: Magnesium: 1.6 mg/dL — ABNORMAL LOW (ref 1.7–2.4)

## 2019-05-30 MED ORDER — AMIODARONE HCL IN DEXTROSE 360-4.14 MG/200ML-% IV SOLN: 30.0000 mg/h | INTRAVENOUS | Status: DC

## 2019-05-30 MED ORDER — POTASSIUM CHLORIDE CRYS ER 20 MEQ PO TBCR
40.0000 meq | EXTENDED_RELEASE_TABLET | Freq: Once | ORAL | Status: AC
  Administered 2019-05-30: 40 meq via ORAL
  Filled 2019-05-30: qty 2

## 2019-05-30 MED ORDER — POTASSIUM CHLORIDE 20 MEQ PO PACK
40.0000 meq | PACK | Freq: Once | ORAL | Status: AC
  Administered 2019-05-30: 40 meq via ORAL
  Filled 2019-05-30: qty 2

## 2019-05-30 MED ORDER — AMIODARONE HCL 200 MG PO TABS
400.0000 mg | ORAL_TABLET | Freq: Two times a day (BID) | ORAL | Status: DC
  Administered 2019-05-30 – 2019-05-31 (×3): 400 mg via ORAL
  Filled 2019-05-30 (×3): qty 2

## 2019-05-30 MED ORDER — ATORVASTATIN CALCIUM 20 MG PO TABS
40.0000 mg | ORAL_TABLET | Freq: Every day | ORAL | Status: DC
  Administered 2019-05-30: 40 mg via ORAL
  Filled 2019-05-30: qty 2

## 2019-05-30 MED ORDER — MAGNESIUM SULFATE 2 GM/50ML IV SOLN
2.0000 g | Freq: Once | INTRAVENOUS | Status: AC
  Administered 2019-05-30: 2 g via INTRAVENOUS
  Filled 2019-05-30: qty 50

## 2019-05-30 MED ORDER — APIXABAN 5 MG PO TABS
5.0000 mg | ORAL_TABLET | Freq: Two times a day (BID) | ORAL | Status: DC
  Administered 2019-05-30 – 2019-05-31 (×3): 5 mg via ORAL
  Filled 2019-05-30 (×3): qty 1

## 2019-05-30 MED ORDER — AMIODARONE LOAD VIA INFUSION
150.0000 mg | Freq: Once | INTRAVENOUS | Status: AC
  Administered 2019-05-30: 150 mg via INTRAVENOUS
  Filled 2019-05-30: qty 83.34

## 2019-05-30 MED ORDER — AMIODARONE HCL IN DEXTROSE 360-4.14 MG/200ML-% IV SOLN
60.0000 mg/h | INTRAVENOUS | Status: DC
  Administered 2019-05-30 (×2): 60 mg/h via INTRAVENOUS
  Filled 2019-05-30 (×2): qty 200

## 2019-05-30 MED ORDER — SODIUM CHLORIDE 0.9 % IV BOLUS
500.0000 mL | Freq: Once | INTRAVENOUS | Status: AC
  Administered 2019-05-30: 500 mL via INTRAVENOUS

## 2019-05-30 NOTE — Evaluation (Signed)
Physical Therapy Evaluation Patient Details Name: Travis Franklin MRN: [id number] DOB: Jan 11, 1951 Today's Date: 05/30/2019   History of Present Illness  Patient is a 68 year old admitted with SOB, fever. Found to have PNA. Patient has PMH to include: HTN, CKD, HLD.  Clinical Impression  Patient received in bed, agrees to PT. Patient is impulsive initially, requiring cues to wait for me prior to getting up and walking out of room. Patient ambulated without ad, was slightly unsteady. Also ambulated without O2 and sats initially in the mid 90%, dropped to low 80s% after walking 50 feet, with pursed lip breathing patient able to return to 90%. Patient then returned to room. Sats upon returning to room were again in the low 80%. Returned to 91% with pursed lip breathing. Patient will benefit from skilled PT to address his weakness, difficulty walking and to improve safety with mobility.          Follow Up Recommendations Home health PT    Equipment Recommendations  Rolling walker with 5" wheels    Recommendations for Other Services       Precautions / Restrictions Precautions Precautions: Fall Restrictions Weight Bearing Restrictions: No      Mobility  Bed Mobility Overal bed mobility: Independent                Transfers Overall transfer level: Independent                  Ambulation/Gait Ambulation/Gait assistance: Min guard Gait Distance (Feet): 100 Feet   Gait Pattern/deviations: Step-to pattern Gait velocity: decreased   General Gait Details: slightly unsteady with ambulation, may benefit from use of AD  Stairs            Wheelchair Mobility    Modified Rankin (Stroke Patients Only)       Balance Overall balance assessment: Needs assistance Sitting-balance support: Feet supported Sitting balance-Leahy Scale: Good     Standing balance support: No upper extremity supported;During functional activity Standing balance-Leahy Scale: Fair                                Pertinent Vitals/Pain Pain Assessment: No/denies pain    Home Living Family/patient expects to be discharged to:: Private residence Living Arrangements: Spouse/significant other Available Help at Discharge: Family Type of Home: House Home Access: Stairs to enter   [employer] of Steps: 3 at front, 5 in garage with rails. Home Layout: One level Home Equipment: Cane - single point      Prior Function Level of Independence: Independent               Hand Dominance        Extremity/Trunk Assessment   Upper Extremity Assessment Upper Extremity Assessment: Overall WFL for tasks assessed    Lower Extremity Assessment Lower Extremity Assessment: Overall WFL for tasks assessed    Cervical / Trunk Assessment Cervical / Trunk Assessment: Normal  Communication   Communication: No difficulties  Cognition Arousal/Alertness: Awake/alert Behavior During Therapy: WFL for tasks assessed/performed Overall Cognitive Status: Within Functional Limits for tasks assessed                                        General Comments      Exercises     Assessment/Plan    PT Assessment Patient needs continued  PT services  PT Problem List Decreased strength;Decreased mobility;Decreased safety awareness;Decreased activity tolerance;Decreased balance       PT Treatment Interventions Therapeutic activities;Therapeutic exercise;Gait training;Stair training;Functional mobility training;Balance training;Patient/family education    PT Goals (Current goals can be found in the Care Plan section)  Acute Rehab PT Goals Patient Stated Goal: to return home PT Goal Formulation: With patient Time For Goal Achievement: 06/06/19 Potential to Achieve Goals: Good    Frequency Min 2X/week   Barriers to discharge        Co-evaluation               AM-PAC PT "6 Clicks" Mobility  Outcome Measure Help needed turning from  your back to your side while in a flat bed without using bedrails?: A Little Help needed moving from lying on your back to sitting on the side of a flat bed without using bedrails?: A Little Help needed moving to and from a bed to a chair (including a wheelchair)?: A Little Help needed standing up from a chair using your arms (e.g., wheelchair or bedside chair)?: A Little Help needed to walk in hospital room?: A Little Help needed climbing 3-5 steps with a railing? : A Little 6 Click Score: 18    End of Session Equipment Utilized During Treatment: Gait belt Activity Tolerance: Other (comment)(patient limited by repiratory status and O2 sats while walking) Patient left: in bed;with bed alarm set;with call bell/phone within reach Nurse Communication: Mobility status PT Visit Diagnosis: Unsteadiness on feet (R26.81);Muscle weakness (generalized) (M62.81);Difficulty in walking, not elsewhere classified (R26.2)    Time: 1050-1109 PT Time Calculation (min) (ACUTE ONLY): 19 min   Charges:   PT Evaluation $PT Eval Low Complexity: 1 Low PT Treatments $Gait Training: 8-22 mins        Finlay Godbee, PT, GCS 05/30/19,11:44 AM

## 2019-05-30 NOTE — Consult Note (Signed)
Cardiology Consultation:   Patient ID: Travis Franklin; [id number]; [birth year]   Admit date: 05/29/2019 Date of Consult: 05/30/2019  Primary Care Provider: Rusty Aus, MD Primary Cardiologist: new to [facility] - consult by Fletcher Anon   Patient Profile:   Travis Franklin is a 68 y.o. male with a hx of CKD stage III, HTN, HLD, and BPH who is being seen today for the evaluation of new onset Afib with RVR at the request of Dr. Sidney Ace.  History of Present Illness:   Mr. Morgan does not have a prior known cardiac history. He presented to [facility] with 2-3 day history of increased SOB, cough productive of green sputum and low-grade fever with chills and myalgias. He had seen his PCP prior to his presentation with negative COVID-19 testing as an outpatient and was started on Augmentin. He denied any chest pain, palpitations, dizziness, presyncope, or syncope.   Upon the patient's arrival to [facility] they were found to initially be afebrile though developed a fever with a T-max of 101.9 and are currently afebrile, oxygen saturation low to mid 90s% on nasal cannula. CXR showed extensive left-sided PNA. CT chest showed lobar PNA of the left lower lobe with a small parapneumonic effusion, scattered mediastinal and hilar lymph nodes, and age-advanced coronary artery calcifications. Initial EKG showed sinus tachycardia as below with follow EKG later in the day on 7/5 showing new onset Afib with RVR as detailed below. Labs showed hs-Tn 22-->34, WBC 9.7, HGB 12.5, K+ 3.1-->3.2, SCr 1.69-->1.72, albumin 3.9, AST 67, ALT 61, magnesium 1.6, TSH normal, COVID-19 negative, blood culture no growth to date x 2. Upon presentation, he was in sinus tachycardia though developed new onset Afib with RVR around 22:45 on 7/5 with ventricular rates into the 170s bpm. He has been started on IV fluids and ABX per IM. With regards to his Afib with RVR, he was initially given IV metoprolol 5 mg, followed by being placed on a diltiazem gtt  and amiodarone infusion. He converted to sinus rhythm around 6:43 AM this morning. He denies any worsening of SOB or noticeable palpitations while in documented Afib with RVR. He remains on amiodarone gtt with stable BP.   Past Medical History:  Diagnosis Date  . BPH (benign prostatic hyperplasia)   . CKD (chronic kidney disease), stage III (HCC)    baseline cr 1.5  . Hyperlipidemia   . Hypertension     Past Surgical History:  Procedure Laterality Date  . COLONOSCOPY    . TONSILLECTOMY AND ADENOIDECTOMY       Home Meds: Prior to Admission medications   Medication Sig Start Date End Date Taking? Authorizing Provider  amoxicillin-clavulanate (AUGMENTIN) 875-125 MG tablet Take 1 tablet by mouth every 12 (twelve) hours. 05/26/19 06/05/19 Yes [provider]  ELDERBERRY PO Take 1 tablet by mouth daily.   Yes [provider]  loratadine (CLARITIN) 10 MG tablet Take 10 mg by mouth daily.   Yes [provider]  meloxicam (MOBIC) 7.5 MG tablet Take 7.5 mg by mouth daily as needed for pain. 03/17/19  Yes [provider]  pravastatin (PRAVACHOL) 40 MG tablet Take 40 mg by mouth every evening. 03/28/19  Yes [provider]  tamsulosin (FLOMAX) 0.4 MG CAPS capsule TK ONE C PO ONCE D. TK 30 MIN AFTER SAME MEAL EACH DAY 05/02/19  Yes [provider]  thiamine (VITAMIN B-1) 100 MG tablet Take 100 mg by mouth daily.   Yes [provider]  TURMERIC PO Take 1 tablet by mouth daily.   Yes [provider]    Inpatient Medications: Scheduled Meds: . docusate sodium  100 mg Oral BID  . enoxaparin (LOVENOX) injection  40 mg Subcutaneous Q24H  . guaiFENesin  600 mg Oral BID  . potassium chloride  40 mEq Oral Once  . pravastatin  40 mg Oral QPM  . tamsulosin  0.4 mg Oral Daily   Continuous Infusions: . sodium chloride 75 mL/hr at 05/30/19 0553  . amiodarone    . ceFEPime (MAXIPIME) IV 2 g (05/30/19 0550)   PRN Meds: acetaminophen  **OR** acetaminophen, ondansetron **OR** ondansetron (ZOFRAN) IV, polyethylene glycol  Allergies:  No Known Allergies  Social History:   Social History   Socioeconomic History  . Marital status: Married    Spouse name: Not on file  . Number of children: Not on file  . Years of education: Not on file  . Highest education level: Not on file  Occupational History  . Not on file  Social Needs  . Financial resource strain: Not on file  . Food insecurity    Worry: Not on file    Inability: Not on file  . Transportation needs    Medical: Not on file    Non-medical: Not on file  Tobacco Use  . Smoking status: Former Games developermoker  . Smokeless tobacco: Never Used  . Tobacco comment: quit 30 years ago  Substance and Sexual Activity  . Alcohol use: Never    Frequency: Never  . Drug use: Not on file  . Sexual activity: Not on file  Lifestyle  . Physical activity    Days per week: Not on file    Minutes per session: Not on file  . Stress: Not on file  Relationships  . Social Musicianconnections    Talks on phone: Not on file    Gets together: Not on file    Attends religious service: Not on file    Active member of club or organization: Not on file    Attends meetings of clubs or organizations: Not on file    Relationship status: Not on file  . Intimate partner violence    Fear of current or ex partner: Not on file    Emotionally abused: Not on file    Physically abused: Not on file    Forced sexual activity: Not on file  Other Topics Concern  . Not on file  Social History Narrative   Lives at home with his wife.  Independent at baseline     Family History:   Family History  Problem Relation Age of Onset  . Hypertension Mother     ROS:  Review of Systems  Constitutional: Positive for chills, fever and malaise/fatigue. Negative for diaphoresis and weight loss.  HENT: Negative for congestion.   Eyes: Negative for discharge and redness.  Respiratory: Positive for cough, sputum  production, shortness of breath and wheezing. Negative for hemoptysis.        Green sputum   Cardiovascular: Negative for chest pain, palpitations, orthopnea, claudication, leg swelling and PND.  Gastrointestinal: Negative for abdominal pain, blood in stool, heartburn, melena, nausea and vomiting.  Genitourinary: Negative for hematuria.  Musculoskeletal: Positive for myalgias. Negative for falls.  Skin: Negative for rash.  Neurological: Positive for weakness. Negative for dizziness, tingling, tremors, sensory change, speech change, focal weakness and loss of consciousness.  Endo/Heme/Allergies: Does not bruise/bleed easily.  Psychiatric/Behavioral: Negative for substance abuse. The patient is not nervous/anxious.  All other systems reviewed and are negative.     Physical Exam/Data:   Vitals:   05/30/19 0300 05/30/19 0315 05/30/19 0557 05/30/19 0720  BP: 117/84 105/74 (!) 144/86 135/74  Pulse: (!) 152 (!) 110 (!) 160 93  Resp:   20   Temp:   98.2 F (36.8 C) 98.5 F (36.9 C)  TempSrc:   Oral Oral  SpO2:   95% 97%  Weight:      Height:        Intake/Output Summary (Last 24 hours) at 05/30/2019 0820 Last data filed at 05/30/2019 0600 Gross per 24 hour  Intake 2472.06 ml  Output 990 ml  Net 1482.06 ml   Filed Weights   05/29/19 0857 05/29/19 1239  Weight: 77.1 kg 79.9 kg   Body mass index is 25.27 kg/m.   Physical Exam: General: Well developed, well nourished, in no acute distress. Head: Normocephalic, atraumatic, sclera non-icteric, no xanthomas, nares without discharge.  Neck: Negative for carotid bruits. JVD not elevated. Lungs: Diminished, coarse breath sounds along the left mid lung and left base. Breathing is unlabored. Heart: RRR with S1 S2. No murmurs, rubs, or gallops appreciated. Abdomen: Soft, non-tender, non-distended with normoactive bowel sounds. No hepatomegaly. No rebound/guarding. No obvious abdominal masses. Msk:  Strength and tone appear normal for age.  Extremities: No clubbing or cyanosis. No edema. Distal pedal pulses are 2+ and equal bilaterally. Neuro: Alert and oriented X 3. No facial asymmetry. No focal deficit. Moves all extremities spontaneously. Psych:  Responds to questions appropriately with a normal affect.   EKG:  The EKG was personally reviewed and demonstrates: 05/29/2019, 8:38 - sinus tachycardia, 124 bpm, nonspecific st/t changes. 05/29/2019, 22:59 - Afib with RVR, 163 bpm, nonspecific st/t changes Telemetry:  Telemetry was personally reviewed and demonstrates: NSR to sinus tachycardia until 22:45 on 7/5 when he developed Afib with RVR with ventricular rates into the 170s bpm with spontaneous conversion to sinus rhythm at 6:43 this morning without significant post-termination pause  Weights: Filed Weights   05/29/19 0857 05/29/19 1239  Weight: 77.1 kg 79.9 kg    Relevant CV Studies: 2D Echo pending  Laboratory Data:  Chemistry Recent Labs  Lab 05/29/19 0850 05/29/19 2328 05/30/19 0049  NA 136 136 137  K 3.6 3.1* 3.2*  CL 103 106 107  CO2 22 18* 20*  GLUCOSE 108* 117* 114*  BUN 27* 28* 26*  CREATININE 1.79* 1.69* 1.72*  CALCIUM 8.9 8.0* 8.2*  GFRNONAA 38* 41* 40*  GFRAA 44* 48* 47*  ANIONGAP 11 12 10     Recent Labs  Lab 05/29/19 0850  PROT 7.5  ALBUMIN 3.0*  AST 67*  ALT 61*  ALKPHOS 117  BILITOT 1.2   Hematology Recent Labs  Lab 05/29/19 0850 05/30/19 0049  WBC 9.7 9.2  RBC 4.16* 3.95*  HGB 12.5* 11.7*  HCT 36.4* 34.4*  MCV 87.5 87.1  MCH 30.0 29.6  MCHC 34.3 34.0  RDW 13.5 13.5  PLT 249 203   Cardiac EnzymesNo results for input(s): TROPONINI in the last 168 hours. No results for input(s): TROPIPOC in the last 168 hours.  BNPNo results for input(s): BNP, PROBNP in the last 168 hours.  DDimer No results for input(s): DDIMER in the last 168 hours.  Radiology/Studies:  Ct Chest Wo Contrast  Result Date: 05/29/2019 IMPRESSION: 1. Lobar pneumonia left lower lobe.  Small parapneumonic  effusion. 2. Scattered mediastinal and hilar lymph nodes likely hyperplastic/inflammatory. 3. The right lung is clear.  No worrisome pulmonary  lesions. 4. Age advanced and fairly extensive three-vessel coronary artery calcifications Aortic Atherosclerosis (ICD10-I70.0). Electronically Signed   By: Rudie MeyerP.  Gallerani M.D.   On: 05/29/2019 12:02   Dg Chest Portable 1 View  Result Date: 05/29/2019 IMPRESSION: Extensive left-sided pneumonia. Followup PA and lateral chest X-ray is recommended in 3-4 weeks following trial of antibiotic therapy to ensure resolution and exclude underlying malignancy. Electronically Signed   By: Rudie MeyerP.  Gallerani M.D.   On: 05/29/2019 09:33    Assessment and Plan:   1. New onset Afib with RVR: -Asymptomatic  -Likely in the setting of his left-sided PNA and hypokalemia  -Converted to sinus rhythm at 6:43 this morning and is maintaining this -He remains high risk for redevelopment of arrhythmia in the setting of his PNA and hypokalemia  -Transition from IV amiodarone to PO amiodarone 400 mg bid x 1 week, 200 mg bid x 1 week, then 200 mg daily thereafter -Ideally, it would be best to taper him off amiodarone as an outpatient once his acute illness has resolved -Check echo -CHADS2VASc at least 3 (HTN, age x 1, vascular disease) -Start Eliquis 5 mg bid (he does not meet reduced dosing criteria) -Replete potassium to goal of 4.0 (has received 40 mEq x 1 overnight and has another 40 mEq ordered for this morning) -Magnesium 1.6 upon admission, patient is status post IV magnesium repletion  -TSH normal  2. Mildly elevated hs-Tn with extensive coronary artery calcification: -Mildly elevated troponin likely in the setting of PNA, Afib with RVR, and poor clearance in the setting of CKD stage III -Check echo -Plan for outpatient Myoview -DOAC in place of ASA as above  3. PNA: -Per IM  4. CKD stage III: -Monitor  5. HTN: -BP is currently well controlled  6. HLD: -Check lipid  panel -Currently on PTA pravastatin, which will be changed to Lipitor in the setting of coronary artery calcification as above  7. Elevated LFT: -Monitor   For questions or updates, please contact [facility] Please consult [email or web address] for contact info under Cardiology/STEMI.   Signed, Eula Listenyan [place], PA-C [facility] Pager: [phone] 05/30/2019, 8:20 AM

## 2019-05-30 NOTE — Progress Notes (Signed)
*  PRELIMINARY RESULTS* Echocardiogram 2D Echocardiogram has been performed.  Travis Franklin 05/30/2019, 9:31 AM

## 2019-05-30 NOTE — Progress Notes (Signed)
Spoke to on call provider concerning patient's HR 160-170's and unable to titrate Cardizem due low blood pressure. New orders received for bolus IVF. Patient denies any chest pain. Will continue to monitor and endorse.

## 2019-05-30 NOTE — Plan of Care (Signed)
  Problem: Education: Goal: Knowledge of General Education information will improve Description: Including pain rating scale, medication(s)/side effects and non-pharmacologic comfort measures Outcome: Progressing   Problem: Health Behavior/Discharge Planning: Goal: Ability to manage health-related needs will improve Outcome: Progressing Note: Patient off of Amiodarone drip at around 9 AM.  Switched to the PO version. H.R. has remained stable. Converted to NSR before dayshift. Will continue to monitor heart rate and rhythm. Wenda Low [facility]

## 2019-05-30 NOTE — Progress Notes (Signed)
[employer] [facility] [place] [facility]   PATIENT NAME: Travis Franklin    MR#:  [id number]  DATE OF BIRTH:  1951-04-25  SUBJECTIVE:   Patient states he is feeling better today.  He feels like his shortness of breath has improved.  He denies any fevers, chills, cough.  Denies any chest pain or palpitations.  REVIEW OF SYSTEMS:  Review of Systems  Constitutional: Negative for chills and fever.  HENT: Negative for congestion and sore throat.   Eyes: Negative for blurred vision and double vision.  Respiratory: Negative for cough and shortness of breath.   Cardiovascular: Negative for chest pain and palpitations.  Gastrointestinal: Negative for nausea and vomiting.  Genitourinary: Negative for dysuria and urgency.  Musculoskeletal: Negative for back pain and neck pain.  Neurological: Negative for dizziness and headaches.  Psychiatric/Behavioral: Negative for depression. The patient is not nervous/anxious.     DRUG ALLERGIES:  No Known Allergies VITALS:  Blood pressure 135/74, pulse 93, temperature 98.5 F (36.9 C), temperature source Oral, resp. rate 20, height 5\' 10"  (1.778 m), weight 79.9 kg, SpO2 91 %. PHYSICAL EXAMINATION:  Physical Exam  GENERAL:  Laying in the bed with no acute distress.  HEENT: Head atraumatic, normocephalic. Pupils equal, round, reactive to light and accommodation. No scleral icterus. Extraocular muscles intact. Oropharynx and nasopharynx clear.  NECK:  Supple, no jugular venous distention. No thyroid enlargement. LUNGS: + Diminished breath sounds throughout the left mid to lower lobe.  Lungs are clear to auscultation bilaterally. No wheezes, crackles, rhonchi. No use of accessory muscles of respiration.  Nasal cannula in place. CARDIOVASCULAR: RRR, S1, S2 normal. No rubs, or gallops. II/VI systolic murmur present. ABDOMEN: Soft, nontender, nondistended. Bowel sounds present.  EXTREMITIES: No pedal edema, cyanosis, or clubbing.  NEUROLOGIC:  CN 2-12 intact, no focal deficits. 5/5 muscle strength throughout all extremities. Sensation intact throughout. Gait not checked.  PSYCHIATRIC: The patient is alert and oriented x 3.  SKIN: No obvious rash, lesion, or ulcer.  LABORATORY PANEL:  Male CBC Recent Labs  Lab 05/30/19 0049  WBC 9.2  HGB 11.7*  HCT 34.4*  PLT 203   ------------------------------------------------------------------------------------------------------------------ Chemistries  Recent Labs  Lab 05/29/19 0850 05/29/19 2328 05/30/19 0049  NA 136 136 137  K 3.6 3.1* 3.2*  CL 103 106 107  CO2 22 18* 20*  GLUCOSE 108* 117* 114*  BUN 27* 28* 26*  CREATININE 1.79* 1.69* 1.72*  CALCIUM 8.9 8.0* 8.2*  MG  --  1.6*  --   AST 67*  --   --   ALT 61*  --   --   ALKPHOS 117  --   --   BILITOT 1.2  --   --    RADIOLOGY:  No results found. ASSESSMENT AND PLAN:   Acute hypoxic respiratory failure secondary to CAP- failed outpatient management with Augmentin.  Remains on 2 L O2 this morning. -Continue cefepime, plan to transition to p.o. antibiotics tomorrow -Blood cultures with no growth to date -Wean O2 as able -PT consult  New onset atrial fibrillation with RVR- likely precipitated by pneumonia.  Patient converted back to normal sinus rhythm this morning. -Cardiology following -Transition from amiodarone drip to p.o. amiodarone today -Start Eliquis -ECHO pending  Elevated troponin- likely demand ischemia in the setting of A. fib with RVR and CAP.  No active chest pain. -ECHO ordered -Cardiology consult- plan for outpatient Myoview -Change home aspirin to Eliquis  AKI in CKD stage III- creatinine a little  higher than baseline. -Continue gentle IV fluids  Hyperlipidemia- stable -Continue home statin  BPH- stable -Continue home Flomax  DVT prophylaxis- eliquis  All the records are reviewed and case discussed with Care Management/Social Worker. Management plans discussed with the patient,  family and they are in agreement.  CODE STATUS: Full Code  TOTAL TIME TAKING CARE OF THIS PATIENT: 40 minutes.   More than 50% of the time was spent in counseling/coordination of care: YES  POSSIBLE D/C IN 1-2 DAYS, DEPENDING ON CLINICAL CONDITION.   Travis Franklin M.D on 05/30/2019 at 12:37 PM  Between 7am to 6pm - Pager - [phone]  After 6pm go to [email or web address] - Proofreader  [facility]  Office  [phone]  CC: Primary care physician; Rusty Aus, MD  Note: This dictation was prepared with Dragon dictation along with smaller phrase technology. Any transcriptional errors that result from this process are unintentional.

## 2019-05-31 ENCOUNTER — Other Ambulatory Visit: Payer: Self-pay | Admitting: Internal Medicine

## 2019-05-31 DIAGNOSIS — I480 Paroxysmal atrial fibrillation: Secondary | ICD-10-CM

## 2019-05-31 DIAGNOSIS — J189 Pneumonia, unspecified organism: Secondary | ICD-10-CM

## 2019-05-31 LAB — BASIC METABOLIC PANEL
Anion gap: 9 (ref 5–15)
BUN: 31 mg/dL — ABNORMAL HIGH (ref 8–23)
CO2: 19 mmol/L — ABNORMAL LOW (ref 22–32)
Calcium: 8.1 mg/dL — ABNORMAL LOW (ref 8.9–10.3)
Chloride: 111 mmol/L (ref 98–111)
Creatinine, Ser: 1.55 mg/dL — ABNORMAL HIGH (ref 0.61–1.24)
GFR calc Af Amer: 53 mL/min — ABNORMAL LOW (ref >60)
GFR calc non Af Amer: 46 mL/min — ABNORMAL LOW (ref >60)
Glucose, Bld: 106 mg/dL — ABNORMAL HIGH (ref 70–99)
Potassium: 3.4 mmol/L — ABNORMAL LOW (ref 3.5–5.1)
Sodium: 139 mmol/L (ref 135–145)

## 2019-05-31 LAB — CBC
HCT: 31.4 % — ABNORMAL LOW (ref 39.0–52.0)
Hemoglobin: 10.6 g/dL — ABNORMAL LOW (ref 13.0–17.0)
MCH: 30.2 pg (ref 26.0–34.0)
MCHC: 33.8 g/dL (ref 30.0–36.0)
MCV: 89.5 fL (ref 80.0–100.0)
Platelets: 191 10*3/uL (ref 150–400)
RBC: 3.51 MIL/uL — ABNORMAL LOW (ref 4.22–5.81)
RDW: 14.2 % (ref 11.5–15.5)
WBC: 7.1 10*3/uL (ref 4.0–10.5)
nRBC: 0 % (ref 0.0–0.2)

## 2019-05-31 LAB — HIV ANTIBODY (ROUTINE TESTING W REFLEX): HIV Screen 4th Generation wRfx: NONREACTIVE

## 2019-05-31 LAB — EXPECTORATED SPUTUM ASSESSMENT W GRAM STAIN, RFLX TO RESP C

## 2019-05-31 MED ORDER — APIXABAN 5 MG PO TABS: 5.0000 mg | ORAL_TABLET | Freq: Two times a day (BID) | ORAL | 0 refills | Status: AC

## 2019-05-31 MED ORDER — CEFDINIR 300 MG PO CAPS: 300.0000 mg | ORAL_CAPSULE | Freq: Two times a day (BID) | ORAL | 0 refills | Status: DC

## 2019-05-31 MED ORDER — POTASSIUM CHLORIDE CRYS ER 10 MEQ PO TBCR
10.0000 meq | EXTENDED_RELEASE_TABLET | Freq: Once | ORAL | Status: AC
  Administered 2019-05-31: 10 meq via ORAL
  Filled 2019-05-31: qty 1

## 2019-05-31 MED ORDER — AMIODARONE HCL 200 MG PO TABS: ORAL_TABLET | ORAL | 0 refills | Status: AC

## 2019-05-31 MED ORDER — SODIUM CHLORIDE 0.9% FLUSH
3.0000 mL | Freq: Two times a day (BID) | INTRAVENOUS | Status: DC
  Administered 2019-05-31: 3 mL via INTRAVENOUS

## 2019-05-31 MED ORDER — APIXABAN 5 MG PO TABS: 5.0000 mg | ORAL_TABLET | Freq: Two times a day (BID) | ORAL | 0 refills | Status: DC

## 2019-05-31 NOTE — TOC Initial Note (Addendum)
Transition of Care [facility](TOC) - Initial/Assessment Note    Patient Details  Name: Travis NuttingJoseph L Dice MRN: [id number] Date of Birth: 10-Nov-1951  Transition of Care [facility](TOC) CM/SW Contact:    Darleene CleaverAnterhaus, Grayton Lobo R, LCSW Phone Number: 05/31/2019, 2:09 PM  Clinical Narrative:                  Patient is a 68 year old male who is alert and oriented x4.  Patient lives with his wife, patient states that he has not had home health, but his wife has in the past.  Patient was explained role of CSW and process for setting up home health.  Patient was asked if he had a preference for home health agencies, patient stated to call his wife to find out which agency she used.  CSW contacted patient's wife, and gave her choice, and she chose [facility].  CSW contacted [facility] and they can accept patient.  CSW updated [facility] that patient is discharging today.  Expected Discharge Plan: Home w Home Health Services Barriers to Discharge: No Barriers Identified   Patient Goals and CMS Choice Patient states their goals for this hospitalization and ongoing recovery are:: To return back home CMS Medicare.gov Compare Post Acute Care list provided to:: Patient Represenative (must comment) Choice offered to / list presented to : Spouse  Expected Discharge Plan and Services Expected Discharge Plan: Home w Home Health Services In-house Referral: Clinical Social Work   Post Acute Care Choice: Home Health Living arrangements for the past 2 months: Single Family Home Expected Discharge Date: 05/31/19               DME Arranged: N/A DME Agency: NA       HH Arranged: RN, PT HH Agency: [facility] Date HH Agency Contacted: 05/31/19 Time HH Agency Contacted: 1130 [facility]  Prior Living Arrangements/Services Living arrangements for the past 2 months: Single Family Home Lives with:: Spouse Patient language and need for interpreter reviewed::  Yes Do you feel safe going back to the place where you live?: Yes      Need for Family Participation in Patient Care: No (Comment) Care giver support system in place?: Yes (comment)   Criminal Activity/Legal Involvement Pertinent to Current Situation/Hospitalization: No - Comment as needed  Activities of Daily Living Home Assistive Devices/Equipment: None ADL Screening (condition at time of admission) Patient's cognitive ability adequate to safely complete daily activities?: Yes Is the patient deaf or have difficulty hearing?: No Does the patient have difficulty seeing, even when wearing glasses/contacts?: No Does the patient have difficulty concentrating, remembering, or making decisions?: No Patient able to express need for assistance with ADLs?: Yes Does the patient have difficulty dressing or bathing?: No Independently performs ADLs?: Yes (appropriate for developmental age) Does the patient have difficulty walking or climbing stairs?: No Weakness of Legs: None Weakness of Arms/Hands: None  Permission Sought/Granted Permission sought to share information with : Family Supports, Case Manager Permission granted to share information with : Yes, Verbal Permission Granted  Share Information with NAME: Patient's wife Dunk[place] SinkRita  Permission granted to share info w AGENCY: [facility]        Emotional Assessment Appearance:: Appears older than stated age   Affect (typically observed): Accepting, Appropriate, Pleasant, Stable Orientation: : Oriented to Self, Oriented to Place, Oriented to  Time, Oriented to Situation Alcohol / Substance Use: Not Applicable Psych Involvement: No (comment)  Admission diagnosis:  Community  acquired pneumonia of left lung, unspecified part of lung [J18.9] Patient Active Problem List   Diagnosis Date Noted  . Pneumonia 05/29/2019   PCP:  Rusty Aus, MD Pharmacy:   [facility], [place] [address] Phone: [phone] Fax: [phone]     Social Determinants of Health (SDOH) Interventions    Readmission Risk Interventions No flowsheet data found.

## 2019-05-31 NOTE — Progress Notes (Signed)
Progress Note  Patient Name: Travis Franklin Date of Encounter: 05/31/2019  Primary Cardiologist:New, Dr. Fletcher Anon  Subjective   No chest pain, palpitations, or racing HR. As previously noted, converted to SR yesterday 7/6.  Inpatient Medications    Scheduled Meds: . amiodarone  400 mg Oral BID  . apixaban  5 mg Oral BID  . atorvastatin  40 mg Oral q1800  . docusate sodium  100 mg Oral BID  . guaiFENesin  600 mg Oral BID  . sodium chloride flush  3 mL Intravenous Q12H  . tamsulosin  0.4 mg Oral Daily   Continuous Infusions: . sodium chloride 75 mL/hr at 05/30/19 0553  . ceFEPime (MAXIPIME) IV 2 g (05/31/19 0600)   PRN Meds: acetaminophen **OR** acetaminophen, polyethylene glycol   Vital Signs    Vitals:   05/30/19 1126 05/30/19 1952 05/31/19 0322 05/31/19 0715  BP:  (!) 144/71 119/70 139/76  Pulse:  (!) 102 83 83  Resp:    19  Temp:  98.8 F (37.1 C) 98.4 F (36.9 C) 97.7 F (36.5 C)  TempSrc:  Oral Oral Oral  SpO2: 91% 94% 93% 95%  Weight:      Height:        Intake/Output Summary (Last 24 hours) at 05/31/2019 1237 Last data filed at 05/31/2019 1226 Gross per 24 hour  Intake 318 ml  Output 1150 ml  Net -832 ml   Filed Weights   05/29/19 0857 05/29/19 1239  Weight: 77.1 kg 79.9 kg    Telemetry    SR - Personally Reviewed  ECG    No new tracings - Personally Reviewed  Physical Exam   GEN: No acute distress.  Completing crossword puzzle. Neck: No JVD Cardiac: RRR, no murmurs, rubs, or gallops.  Respiratory: Diminished bibasilar breath sounds. Coarse L sided breath sounds  GI: Soft, nontender, non-distended  MS: No edema; No deformity. Neuro:  Nonfocal  Psych: Normal affect   Labs    Chemistry Recent Labs  Lab 05/29/19 0850 05/29/19 2328 05/30/19 0049 05/31/19 0507  NA 136 136 137 139  K 3.6 3.1* 3.2* 3.4*  CL 103 106 107 111  CO2 22 18* 20* 19*  GLUCOSE 108* 117* 114* 106*  BUN 27* 28* 26* 31*  CREATININE 1.79* 1.69* 1.72* 1.55*   CALCIUM 8.9 8.0* 8.2* 8.1*  PROT 7.5  --   --   --   ALBUMIN 3.0*  --   --   --   AST 67*  --   --   --   ALT 61*  --   --   --   ALKPHOS 117  --   --   --   BILITOT 1.2  --   --   --   GFRNONAA 38* 41* 40* 46*  GFRAA 44* 48* 47* 53*  ANIONGAP 11 12 10 9      Hematology Recent Labs  Lab 05/29/19 0850 05/30/19 0049 05/31/19 0507  WBC 9.7 9.2 7.1  RBC 4.16* 3.95* 3.51*  HGB 12.5* 11.7* 10.6*  HCT 36.4* 34.4* 31.4*  MCV 87.5 87.1 89.5  MCH 30.0 29.6 30.2  MCHC 34.3 34.0 33.8  RDW 13.5 13.5 14.2  PLT 249 203 191    Cardiac EnzymesNo results for input(s): TROPONINI in the last 168 hours. No results for input(s): TROPIPOC in the last 168 hours.   BNPNo results for input(s): BNP, PROBNP in the last 168 hours.   DDimer No results for input(s): DDIMER in the last 168  hours.   Radiology    No results found.  Cardiac Studies   Echo  05/30/2019  1. The left ventricle has low normal systolic function, with an ejection fraction of 50-55%. The cavity size was mildly dilated. Left ventricular diastolic parameters were normal.  2. The right ventricle has normal systolic function. The cavity was normal. There is no increase in right ventricular wall thickness. Right ventricular systolic pressure could not be assessed.  3. Right atrial size was mildly dilated.  4. The mitral valve is grossly normal.  5. The tricuspid valve is grossly normal.  6. The aortic valve is abnormal. Mild thickening of the aortic valve. Moderate calcification of the aortic valve. Mild stenosis of the aortic valve.  Patient Profile     68 y.o. male with a h/o PAF, CKDIII, HTN, HLD, and BPH who is being seen today for new onset Afib with RVR in the setting of pna.  Assessment & Plan    New PAF with RVR - Currently SR.  Asymptomatic when in Afib. Etiology thought likely Afib in setting of pna and hypokalemia. - CHA2DS2VASc score of at least  3 (HTN, age, vascular).  - Converted back to SR at 6:43AM on 7/6.  Remains in SR.  - Given high risk for redevelopment of arrhythmia, recommendation is for him to remain on po amiodarone 400mg  BID x1wk, 200mg  BID x1 week, then 200mg  daily after that time. Preference is to taper off of amiodarone after 2-3 months and as an outpatient and with resolution of pna. - Recommend continue to replete K with goal 4.0. Daily BMET to monitor renal function and electrolytes. - Continue long term anticoagulation with Eliquis 5mg  BID as does not meet reduced dosing criteria - Updated echo above with EF normal  - Rate currently controlled in SR. Consider starting low dose metoprolol following amiodarone therapy as an outpatient and for rate control for further episodes of Afib. - Will need follow-up in the office  Mildly elevated Hs Tn - No CP. Thought likely elevation in the setting of pna, Afib with RVR, and CKDIII. - Hs troponin 22  34 - Echo with EF normal and no wall motion abnormalities - Outpatient Myoview to be scheduled given noted coronary artery calcifications on CT scan. No ASA as on Eliquis as above.  Mild AS - Most recent echo as above with mild AS. Asx at this time and will monitor as outpatient / updated echo if symptomatic in future.   Hypokalemia  - Current K 3.4 with goal 4.0 - replete with goal 4.0. Continue to monitor Mg.  Pna - Per IM  CKD3  - Monitor  HTN - BP controlled  HLD - Continue statin    For questions or updates, please contact [facility] Please consult [email or web address] for contact info under        Signed, Lennon AlstromJacquelyn D Octa Uplinger, PA-C  05/31/2019, 12:37 PM

## 2019-05-31 NOTE — Progress Notes (Signed)
Pt discharged to home via wc.  Instructions and rx given to pt.  Questions answered.  No distress.  

## 2019-05-31 NOTE — Discharge Instructions (Signed)
It was so nice to meet you during this hospitalization!  You came into the hospital with shortness of breath. We treated you for a pneumonia. You also had a high heart rate while you were here and we had to start you on a medicine to bring this heart rate down. That heart rhythm is called atrial fibrillation. We also had to start you on a blood thinner to decrease your risk of strokes (atrial fibrillation can put people at increased risk of developing a stroke).  I have prescribed the following medications: 1. Cefdinir (this is your antibiotic)- take 1 tablet twice a day, starting tonight and then continuing for 2 more days 2. Eliquis (this is your blood thinner)- take 1 tablet twice a day 3. Amiodarone (this is the medicine to help lower your heart rate) -Take 2 tablets (400mg ) twice a day for 1 week -Take 1 tablet (200mg ) twice a day for 1 week -Take 1 tablet (200mg ) once a day after that  Take care, Dr. Brett Albino

## 2019-05-31 NOTE — TOC Transition Note (Addendum)
Transition of Care [facility]) - CM/SW Discharge Note   Patient Details  Name: ARUL FARABEE MRN: [id number] Date of Birth: [birth year]  [facility] CM/SW Contact:  Ross Ludwig, LCSW Phone Number: 05/31/2019, 2:02 PM   Clinical Narrative:     Patient will be discharging back to home with his wife.  Patient will be having home health PT and Nursing with [facility].  Patient has a new orders for Eliquis, bedside nurse requested coupon for patient.  CSW provided 30 day coupon for Eliquis.   Final next level of care: [place] Barriers to Discharge: No Barriers Identified   Patient Goals and CMS Choice Patient states their goals for this hospitalization and ongoing recovery are:: To return back home CMS Medicare.gov Compare Post Acute Care list provided to:: Patient Represenative (must comment) Choice offered to / list presented to : Spouse  Discharge Placement     Patient will be discharging back home with home health.                  Discharge Plan and Services In-house Referral: Clinical Social Work   Post Acute Care Choice: Home Health          DME Arranged: N/A DME Agency: NA       HH Arranged: RN, PT [place] Agency: [place] ([facility]) Date [place]: 05/31/19 Time [place]: 1130 Representative spoke with at [place]: [place] ([facility]) Interventions     Readmission Risk Interventions No flowsheet data found.

## 2019-05-31 NOTE — Discharge Summary (Signed)
[facility] [place] [facility]   PATIENT NAME: Travis Franklin    MR#:  [id number]  DATE OF BIRTH:  01-22-1951  DATE OF ADMISSION:  05/29/2019   ADMITTING PHYSICIAN: Enid Baasadhika Kalisetti, MD  DATE OF DISCHARGE: 05/31/2019  4:21 PM  PRIMARY CARE PHYSICIAN: Danella PentonMiller, Travis F, MD   ADMISSION DIAGNOSIS:  Community acquired pneumonia of left lung, unspecified part of lung [J18.9] DISCHARGE DIAGNOSIS:  Active Problems:   Community acquired pneumonia of left lung   Paroxysmal atrial fibrillation (HCC)  SECONDARY DIAGNOSIS:   Past Medical History:  Diagnosis Date  . BPH (benign prostatic hyperplasia)   . CKD (chronic kidney disease), stage III (HCC)    baseline cr 1.5  . Hyperlipidemia   . Hypertension    HOSPITAL COURSE:   Travis LongsJoseph is a 68 year old male who presented to the ED with shortness of breath, cough, and low-grade fever.  He was seen by his PCP 3 days prior to arrival in the ED and was started on Augmentin for presumed pneumonia.  In the ED, CT chest showed a left lower lobe pneumonia.  He was admitted for further management.  CAP- failed outpatient management with Augmentin.  -Initially required 2 L O2 by nasal cannula, but was stable on room air at the time of discharge -Treated with cefepime and then [facility] total 5-day course of antibiotics -Blood cultures with no growth to date -Wean O2 as able -PT recommended home health PT  New onset atrial fibrillation with RVR- likely precipitated by pneumonia.  Patient converted back to normal sinus rhythm and was stable at the time of discharge -Seen by cardiology -Initially on amiodarone drip, but was transitioned to p.o. amiodarone on discharge -Initially on heparin drip, but was transitioned to Eliquis on discharge -Patient will need to follow-up with cardiology as an outpatient  Elevated troponin- likely demand ischemia in the setting of A. fib with RVR and CAP.  No active chest  pain. -ECHO  with EF 50 to 55% and no wall motion abnormalities. -Cardiology recommended outpatient Myoview  CKD stage III- patient initially had an AKI, but creatinine returned to baseline with gentle IV fluids at the time of discharge.  Hyperlipidemia- stable -Continued home statin  BPH- stable -Continued home Flomax  DISCHARGE CONDITIONS:  Community acquired pneumonia New onset atrial fibrillation Elevated troponin CKD 3 Hyperlipidemia BPH CONSULTS OBTAINED:  Treatment Team:  Iran OuchArida, Muhammad A, MD DRUG ALLERGIES:  No Known Allergies DISCHARGE MEDICATIONS:   Allergies as of 05/31/2019   No Known Allergies     Medication List    STOP taking these medications   amoxicillin-clavulanate 875-125 MG tablet Commonly known as: AUGMENTIN   meloxicam 7.5 MG tablet Commonly known as: MOBIC     TAKE these medications   amiodarone 200 MG tablet Commonly known as: PACERONE Take 2 tablets (400mg ) twice daily for 1 week, then take 1 tablet (200mg ) twice daily for 1 week, then take 1 tablet (200mg ) daily after that   apixaban 5 MG Tabs tablet Commonly known as: ELIQUIS Take 1 tablet (5 mg total) by mouth 2 (two) times daily.   cefdinir 300 MG capsule Commonly known as: OMNICEF Take 1 capsule (300 mg total) by mouth 2 (two) times daily.   cefdinir 300 MG capsule Commonly known as: OMNICEF Take 1 capsule (300 mg total) by mouth 2 (two) times daily.   ELDERBERRY PO Take 1 tablet by mouth daily.   loratadine 10 MG tablet Commonly known  as: CLARITIN Take 10 mg by mouth daily.   pravastatin 40 MG tablet Commonly known as: PRAVACHOL Take 40 mg by mouth every evening.   tamsulosin 0.4 MG Caps capsule Commonly known as: FLOMAX TK ONE C PO ONCE D. TK 30 MIN AFTER SAME MEAL EACH DAY   thiamine 100 MG tablet Commonly known as: VITAMIN B-1 Take 100 mg by mouth daily.   TURMERIC PO Take 1 tablet by mouth daily.            Durable Medical Equipment  (From  admission, onward)         Start     Ordered   05/30/19 1256  For home use only DME Walker rolling  Once    Question:  Patient needs a walker to treat with the following condition  Answer:  Generalized weakness   05/30/19 1255           DISCHARGE INSTRUCTIONS:  1.  Follow-up with PCP in 5 days 2.  Follow-up with cardiology in 1 week 3.  Start Eliquis 5 mg twice daily 4.  Take amiodarone taper as prescribed (400 mg twice daily x1 week, 200 mg twice daily x1 week, 200 mg daily after that) 5.  Stop aspirin 6.  Take Omnicef twice daily for 2 more days to complete course of antibiotics DIET:  Cardiac diet DISCHARGE CONDITION:  Stable ACTIVITY:  Activity as tolerated OXYGEN:  Home Oxygen: No.  Oxygen Delivery: room air DISCHARGE LOCATION:  home   If you experience worsening of your admission symptoms, develop shortness of breath, life threatening emergency, suicidal or homicidal thoughts you must seek medical attention immediately by calling 911 or calling your MD immediately  if symptoms less severe.  You Must read complete instructions/literature along with all the possible adverse reactions/side effects for all the Medicines you take and that have been prescribed to you. Take any new Medicines after you have completely understood and accpet all the possible adverse reactions/side effects.   Please note  You were cared for by a hospitalist during your hospital stay. If you have any questions about your discharge medications or the care you received while you were in the hospital after you are discharged, you can call the unit and asked to speak with the hospitalist on call if the hospitalist that took care of you is not available. Once you are discharged, your primary care physician will handle any further medical issues. Please note that NO REFILLS for any discharge medications will be authorized once you are discharged, as it is imperative that you return to your primary care  physician (or establish a relationship with a primary care physician if you do not have one) for your aftercare needs so that they can reassess your need for medications and monitor your lab values.    On the day of Discharge:  VITAL SIGNS:  Blood pressure 139/76, pulse 83, temperature 97.7 F (36.5 C), temperature source Oral, resp. rate 19, height 5\' 10"  (1.778 m), weight 79.9 kg, SpO2 95 %. PHYSICAL EXAMINATION:  GENERAL:  68 y.o.-year-old patient lying in the bed with no acute distress.  EYES: Pupils equal, round, reactive to light and accommodation. No scleral icterus. Extraocular muscles intact.  HEENT: Head atraumatic, normocephalic. Oropharynx and nasopharynx clear.  NECK:  Supple, no jugular venous distention. No thyroid enlargement, no tenderness.  LUNGS: Normal breath sounds bilaterally, no wheezing, rales,rhonchi or crepitation. No use of accessory muscles of respiration.  CARDIOVASCULAR: RRR, S1, S2 normal. No murmurs, rubs,  or gallops.  ABDOMEN: Soft, non-tender, non-distended. Bowel sounds present. No organomegaly or mass.  EXTREMITIES: No pedal edema, cyanosis, or clubbing.  NEUROLOGIC: Cranial nerves II through XII are intact. + Global weakness. Sensation intact. Gait not checked.  PSYCHIATRIC: The patient is alert and oriented x 3.  SKIN: No obvious rash, lesion, or ulcer.  DATA REVIEW:   CBC Recent Labs  Lab 05/31/19 0507  WBC 7.1  HGB 10.6*  HCT 31.4*  PLT 191    Chemistries  Recent Labs  Lab 05/29/19 0850 05/29/19 2328  05/31/19 0507  NA 136 136   < > 139  K 3.6 3.1*   < > 3.4*  CL 103 106   < > 111  CO2 22 18*   < > 19*  GLUCOSE 108* 117*   < > 106*  BUN 27* 28*   < > 31*  CREATININE 1.79* 1.69*   < > 1.55*  CALCIUM 8.9 8.0*   < > 8.1*  MG  --  1.6*  --   --   AST 67*  --   --   --   ALT 61*  --   --   --   ALKPHOS 117  --   --   --   BILITOT 1.2  --   --   --    < > = values in this interval not displayed.     Microbiology Results   Results for orders placed or performed during the hospital encounter of 05/29/19  Blood Culture (routine x 2)     Status: None (Preliminary result)   Collection Time: 05/29/19 10:15 AM   Specimen: BLOOD  Result Value Ref Range Status   Specimen Description BLOOD LEFT ARM  Final   Special Requests   Final    BOTTLES DRAWN AEROBIC AND ANAEROBIC Blood Culture adequate volume   Culture   Final    NO GROWTH 2 DAYS Performed at [facility], [address] [place], [place] [address]    Report Status PENDING  Incomplete  Blood Culture (routine x 2)     Status: None (Preliminary result)   Collection Time: 05/29/19 10:15 AM   Specimen: BLOOD  Result Value Ref Range Status   Specimen Description BLOOD RIGHT ARM  Final   Special Requests   Final    BOTTLES DRAWN AEROBIC AND ANAEROBIC Blood Culture adequate volume   Culture  Setup Time ANAEROBIC BOTTLE ONLY  Final   Culture   Final    NO GROWTH 2 DAYS Performed at [facility], [address] [place], [place] [address]    Report Status PENDING  Incomplete  SARS Coronavirus 2 (CEPHEID - Performed in [facility] hospital lab), Hosp Order     Status: None   Collection Time: 05/29/19 10:22 AM   Specimen: Nasopharyngeal Swab  Result Value Ref Range Status   SARS Coronavirus 2 NEGATIVE NEGATIVE Final    Comment: (NOTE) If result is NEGATIVE SARS-CoV-2 target nucleic acids are NOT DETECTED. The SARS-CoV-2 RNA is generally detectable in upper and lower  respiratory specimens during the acute phase of infection. The lowest  concentration of SARS-CoV-2 viral copies this assay can detect is 250  copies / mL. A negative result does not preclude SARS-CoV-2 infection  and should not be used as the sole basis for treatment or other  patient management decisions.  A negative result may occur with  improper specimen collection / handling, submission of specimen other  than nasopharyngeal  swab, presence of viral mutation(s) within  the  areas targeted by this assay, and inadequate number of viral copies  (<250 copies / mL). A negative result must be combined with clinical  observations, patient history, and epidemiological information. If result is POSITIVE SARS-CoV-2 target nucleic acids are DETECTED. The SARS-CoV-2 RNA is generally detectable in upper and lower  respiratory specimens dur ing the acute phase of infection.  Positive  results are indicative of active infection with SARS-CoV-2.  Clinical  correlation with patient history and other diagnostic information is  necessary to determine patient infection status.  Positive results do  not rule out bacterial infection or co-infection with other viruses. If result is PRESUMPTIVE POSTIVE SARS-CoV-2 nucleic acids MAY BE PRESENT.   A presumptive positive result was obtained on the submitted specimen  and confirmed on repeat testing.  While 2019 novel coronavirus  (SARS-CoV-2) nucleic acids may be present in the submitted sample  additional confirmatory testing may be necessary for epidemiological  and / or clinical management purposes  to differentiate between  SARS-CoV-2 and other Sarbecovirus currently known to infect humans.  If clinically indicated additional testing with an alternate test  methodology [phone]) is advised. The SARS-CoV-2 RNA is generally  detectable in upper and lower respiratory sp ecimens during the acute  phase of infection. The expected result is Negative. Fact Sheet for Patients:  StrictlyIdeas.no Fact Sheet for Healthcare Providers: BankingDealers.co.za This test is not yet approved or cleared by the [place] FDA and has been authorized for detection and/or diagnosis of SARS-CoV-2 by FDA under an Emergency Use Authorization (EUA).  This EUA will remain in effect (meaning this test can be used) for the duration of the COVID-19 declaration under Section 564(b)(1) of the Act, 21  U.S.C. section 360bbb-3(b)(1), unless the authorization is terminated or revoked sooner. Performed at [facility], [address] [place], [place] [address]   MRSA PCR Screening     Status: None   Collection Time: 05/29/19 12:22 PM   Specimen: Nasopharyngeal  Result Value Ref Range Status   MRSA by PCR NEGATIVE NEGATIVE Final    Comment:        The GeneXpert MRSA Assay (FDA approved for NASAL specimens only), is one component of a comprehensive MRSA colonization surveillance program. It is not intended to diagnose MRSA infection nor to guide or monitor treatment for MRSA infections. Performed at [facility], [address] [place], [place] [address]   Expectorated sputum assessment w rflx to resp cult     Status: None   Collection Time: 05/30/19  7:55 PM   Specimen: Expectorated Sputum  Result Value Ref Range Status   Specimen Description EXPECTORATED SPUTUM  Final   Special Requests Normal  Final   Sputum evaluation   Final    Sputum specimen not acceptable for testing.  Please recollect.   Performed at [facility], [address] [place], [place] [address]    Report Status 05/30/2019 FINAL  Final  Expectorated sputum assessment w rflx to resp cult     Status: None   Collection Time: 05/31/19 10:00 AM   Specimen: SPU  Result Value Ref Range Status   Specimen Description SPUTUM  Final   Special Requests NONE  Final   Sputum evaluation   Final    THIS SPECIMEN IS ACCEPTABLE FOR SPUTUM CULTURE Performed at [facility] [address] [place], [place] [address]    Report Status 05/31/2019 FINAL  Final  Culture, respiratory  Status: None (Preliminary result)   Collection Time: 05/31/19 10:00 AM   Specimen: SPU  Result Value Ref Range Status   Specimen Description   Final    SPUTUM Performed at [facility], [address] [place], [place] [address]    Special Requests   Final    NONE Reflexed from [id number]  Performed at [facility], [address] [place], [place] [address]    Gram Stain   Final    ABUNDANT WBC PRESENT, PREDOMINANTLY MONONUCLEAR MODERATE GRAM NEGATIVE RODS RARE GRAM POSITIVE COCCI FEW GRAM VARIABLE ROD Performed at [facility], [address], [place], [place] [address]    Culture PENDING  Incomplete   Report Status PENDING  Incomplete    RADIOLOGY:  No results found.   Management plans discussed with the patient, family and they are in agreement.  CODE STATUS: Full Code   TOTAL TIME TAKING CARE OF THIS PATIENT: 45 minutes.    Jinny Blossom Gerome Kokesh M.D on 05/31/2019 at 4:53 PM  Between 7am to 6pm - Pager [phone]  After 6pm go to [email or web address] - Social research officer, government  [facility] [place] [facility]  Office  [phone]  CC: Primary care physician; Danella Penton, MD   Note: This dictation was prepared with Dragon dictation along with smaller phrase technology. Any transcriptional errors that result from this process are unintentional.

## 2019-05-31 NOTE — Plan of Care (Signed)
  Problem: Education: Goal: Knowledge of General Education information will improve Description: Including pain rating scale, medication(s)/side effects and non-pharmacologic comfort measures Outcome: Progressing   Problem: Health Behavior/Discharge Planning: Goal: Ability to manage health-related needs will improve Outcome: Progressing   Problem: Clinical Measurements: Goal: Ability to maintain clinical measurements within normal limits will improve Outcome: Progressing Goal: Will remain free from infection Outcome: Progressing Note: Remains afebrile but on IV antibiotics Goal: Diagnostic test results will improve Outcome: Progressing   Problem: Clinical Measurements: Goal: Respiratory complications will improve Outcome: Not Progressing Note: Continues to get SOB on exertion, has productive cough

## 2019-05-31 NOTE — Plan of Care (Signed)
  Problem: Education: Goal: Knowledge of General Education information will improve Description: Including pain rating scale, medication(s)/side effects and non-pharmacologic comfort measures 05/31/2019 1513 by Etheleen Nicks, RN Outcome: Completed/Met 05/31/2019 1003 by Etheleen Nicks, RN Outcome: Progressing   Problem: Health Behavior/Discharge Planning: Goal: Ability to manage health-related needs will improve 05/31/2019 1513 by Etheleen Nicks, RN Outcome: Completed/Met 05/31/2019 1003 by Etheleen Nicks, RN Outcome: Progressing   Problem: Clinical Measurements: Goal: Ability to maintain clinical measurements within normal limits will improve 05/31/2019 1513 by Etheleen Nicks, RN Outcome: Completed/Met 05/31/2019 1003 by Etheleen Nicks, RN Outcome: Progressing Goal: Will remain free from infection 05/31/2019 1513 by Etheleen Nicks, RN Outcome: Completed/Met 05/31/2019 1003 by Etheleen Nicks, RN Outcome: Progressing Note: Remains afebrile but on IV antibiotics Goal: Diagnostic test results will improve 05/31/2019 1513 by Etheleen Nicks, RN Outcome: Completed/Met 05/31/2019 1003 by Etheleen Nicks, RN Outcome: Progressing Goal: Respiratory complications will improve 05/31/2019 1513 by Etheleen Nicks, RN Outcome: Completed/Met 05/31/2019 1003 by Etheleen Nicks, RN Outcome: Not Progressing Note: Continues to get SOB on exertion, has productive cough Goal: Cardiovascular complication will be avoided Outcome: Completed/Met   Problem: Activity: Goal: Risk for activity intolerance will decrease Outcome: Completed/Met   Problem: Nutrition: Goal: Adequate nutrition will be maintained Outcome: Completed/Met   Problem: Coping: Goal: Level of anxiety will decrease Outcome: Completed/Met   Problem: Elimination: Goal: Will not experience complications related to bowel motility Outcome: Completed/Met Goal: Will not experience complications related to urinary retention Outcome: Completed/Met    Problem: Pain Managment: Goal: General experience of comfort will improve Outcome: Completed/Met   Problem: Safety: Goal: Ability to remain free from injury will improve Outcome: Completed/Met   Problem: Skin Integrity: Goal: Risk for impaired skin integrity will decrease Outcome: Completed/Met

## 2019-06-02 LAB — CULTURE, RESPIRATORY W GRAM STAIN: Culture: NORMAL

## 2019-06-03 LAB — CULTURE, BLOOD (ROUTINE X 2)
Culture: NO GROWTH
Special Requests: ADEQUATE

## 2019-06-04 LAB — CULTURE, BLOOD (ROUTINE X 2)
Culture: NO GROWTH
Special Requests: ADEQUATE

## 2019-06-08 ENCOUNTER — Ambulatory Visit: Payer: Medicare Other | Admitting: Physician Assistant

## 2019-06-29 ENCOUNTER — Other Ambulatory Visit: Payer: Self-pay | Admitting: Internal Medicine

## 2019-07-30 ENCOUNTER — Other Ambulatory Visit: Payer: Self-pay | Admitting: Internal Medicine

## 2020-07-06 IMAGING — DX PORTABLE CHEST - 1 VIEW
2 series · 2 of 2 positions shown · non-contrast
Comparison: None.

CLINICAL DATA: Fever, chills and shortness of breath.

EXAM:
PORTABLE CHEST 1 VIEW

[chest ap (1 of 2)]
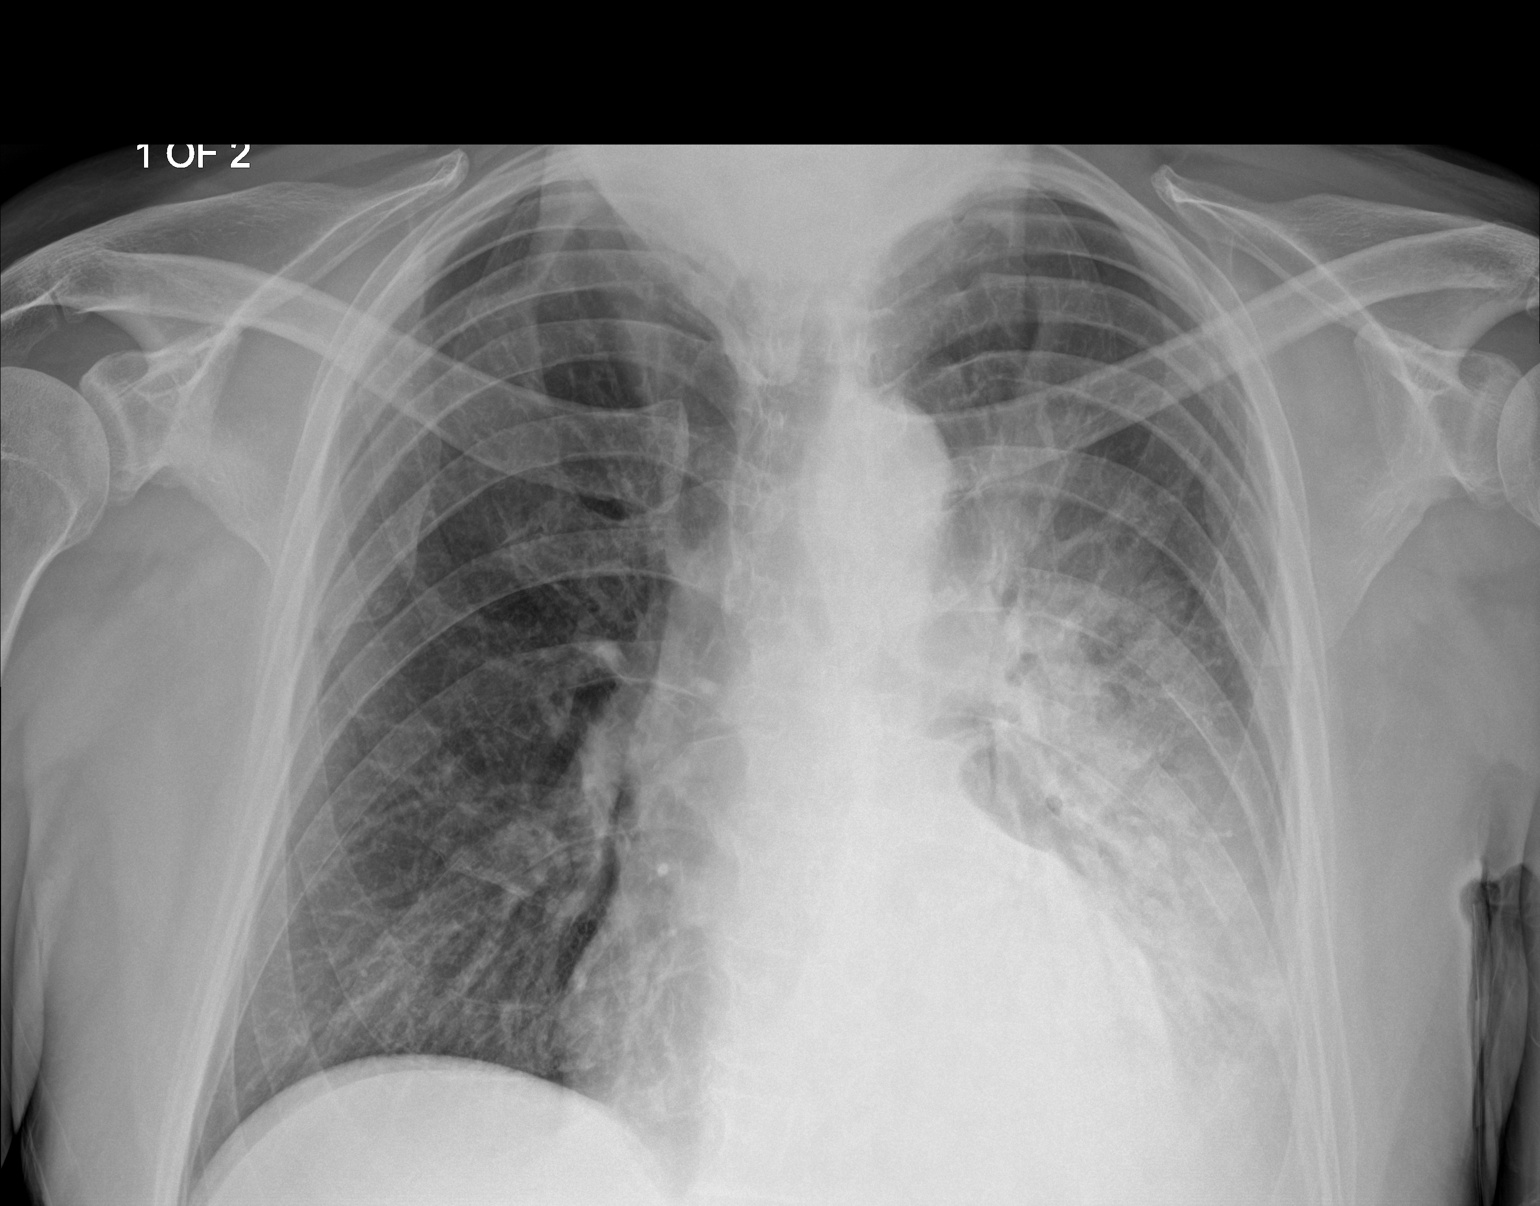

[chest ap (2 of 2)]
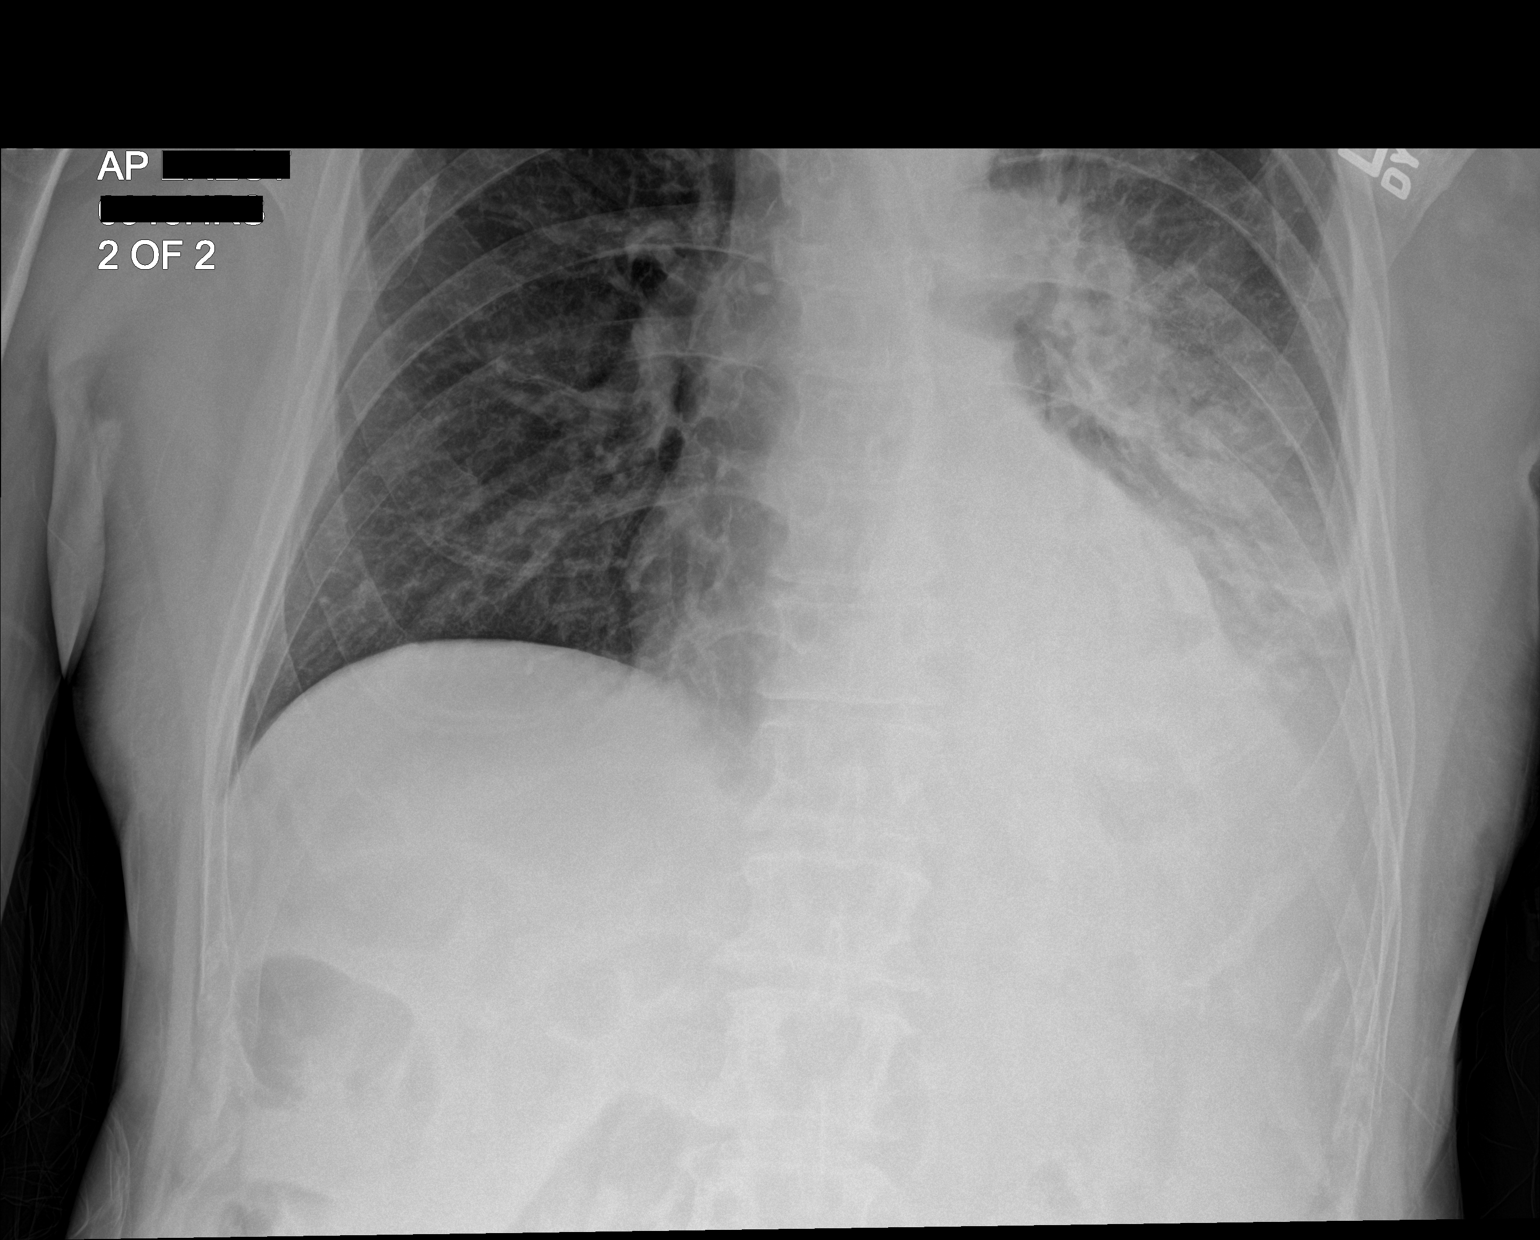

[2 of 2 positions shown; findings below may reference images not displayed]

FINDINGS: The cardiac silhouette, mediastinal and hilar contours are within
normal limits.

Extensive left lung airspace process with airspace consolidation and
air bronchograms. Findings consistent with pneumonia. Suspect
associated parapneumonic effusion. The right lung is clear. The bony
thorax is intact.
IMPRESSION: Extensive left-sided pneumonia.

Followup PA and lateral chest X-ray is recommended in 3-4 weeks
following trial of antibiotic therapy to ensure resolution and
exclude underlying malignancy.

## 2020-07-06 IMAGING — CT CT CHEST WITHOUT CONTRAST
2 of 3 series · 15 of 36 positions shown, 18 images · non-contrast
Comparison: Chest x-ray earlier today.

CLINICAL DATA: Shortness of breath.  Abnormal chest x-ray.

EXAM:
CT CHEST WITHOUT CONTRAST
TECHNIQUE: Multidetector CT imaging of the chest was performed following the
standard protocol without IV contrast.

[Series 2: thorax · axial · 0.62mm/px · z∈[-331,-33]mm · 12 of 177 slices shown, 15 images]
[im 14/177  mediastinal]
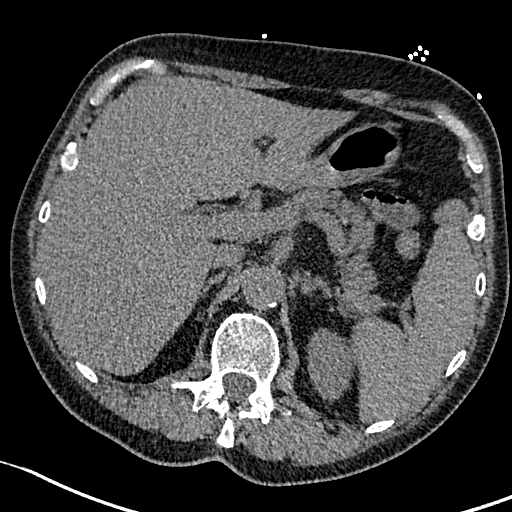
[im 14/177  lung]
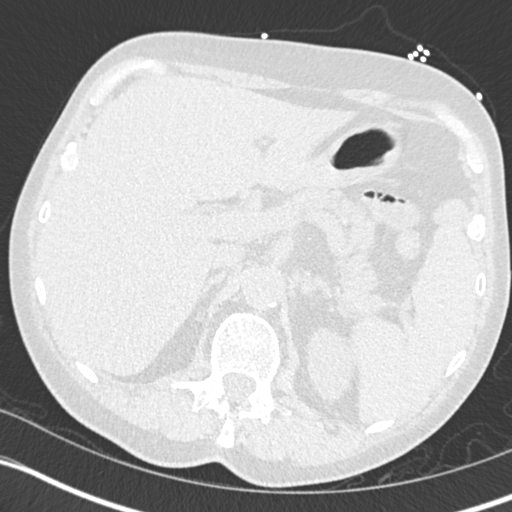
[im 27/177  lung]
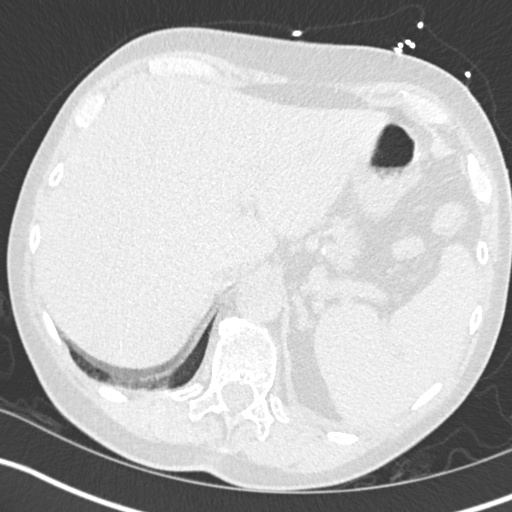
[im 40/177  lung]
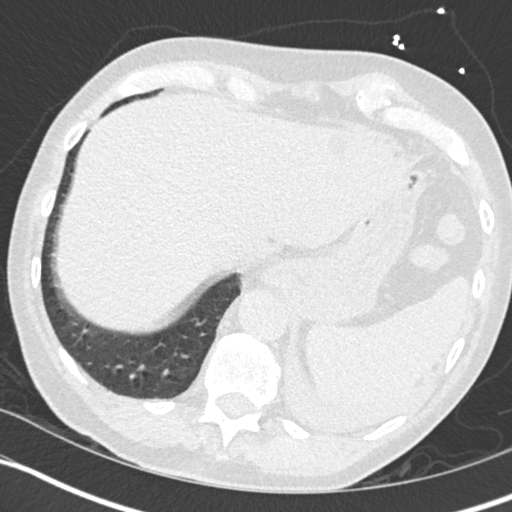
[im 53/177  lung]
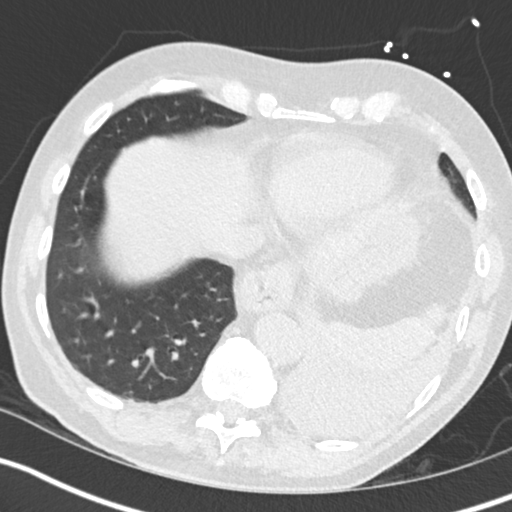
[im 66/177  mediastinal]
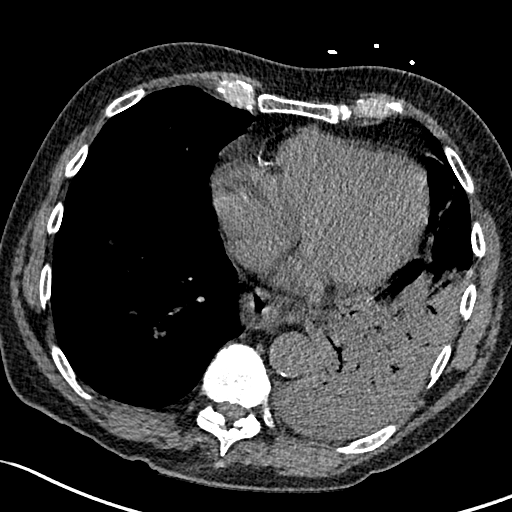
[im 66/177  lung]
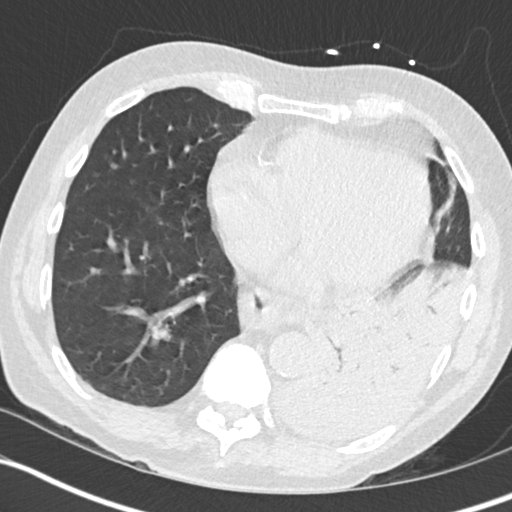
[im 79/177  lung]
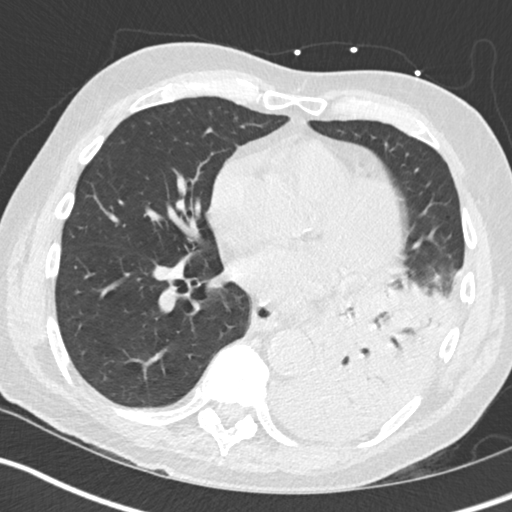
[im 98/177  lung]
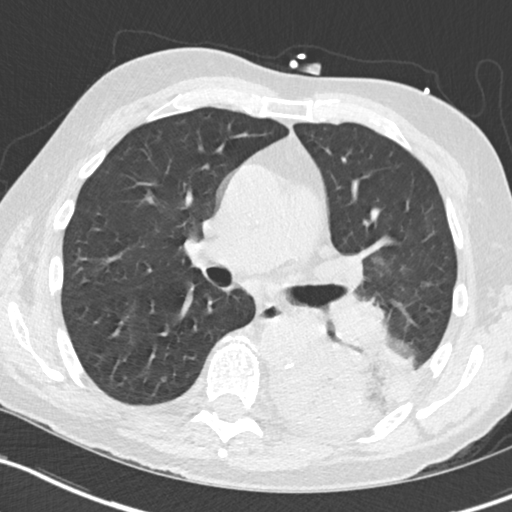
[im 111/177  lung]
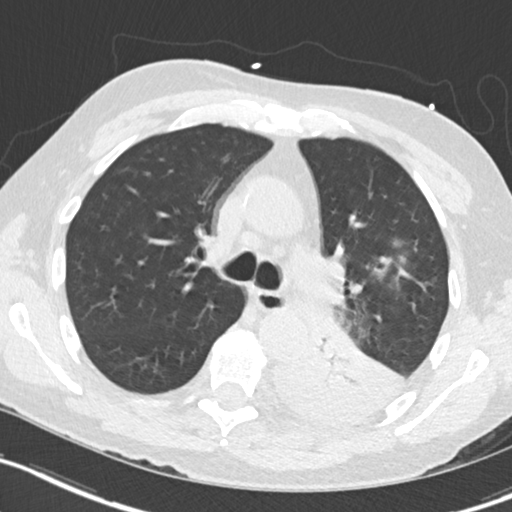
[im 124/177  mediastinal]
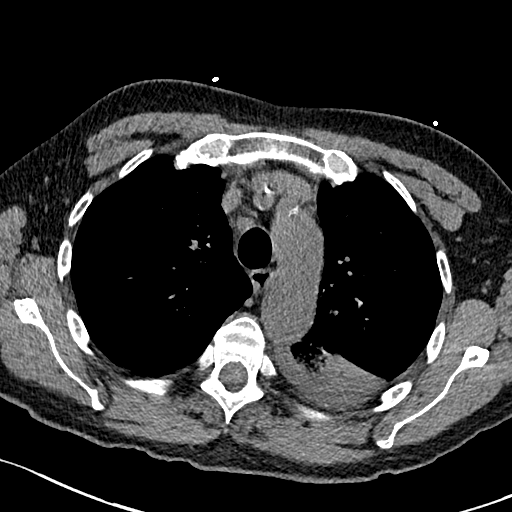
[im 124/177  lung]
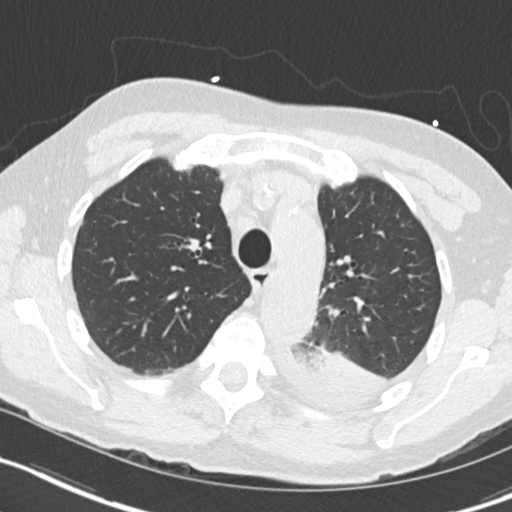
[im 137/177  lung]
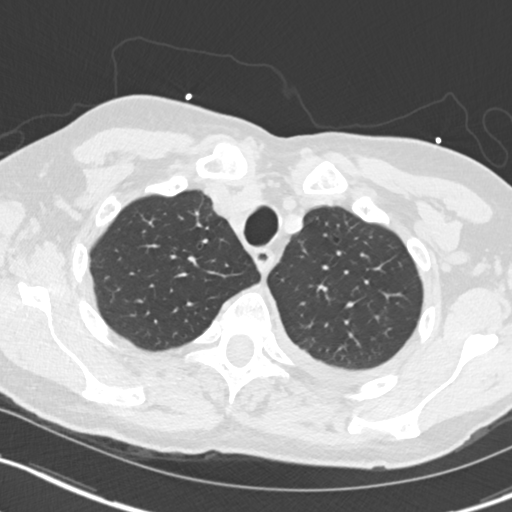
[im 150/177  lung]
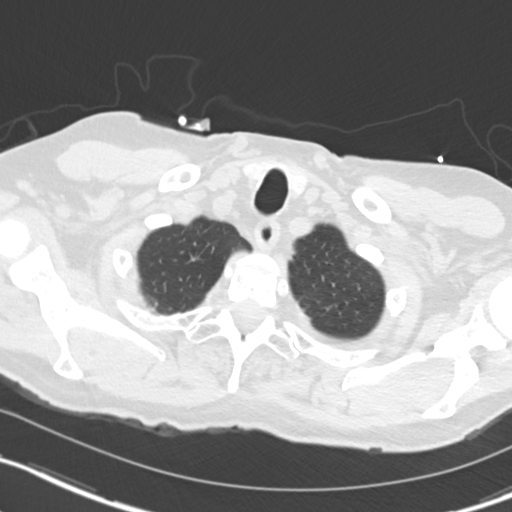
[im 163/177  lung]
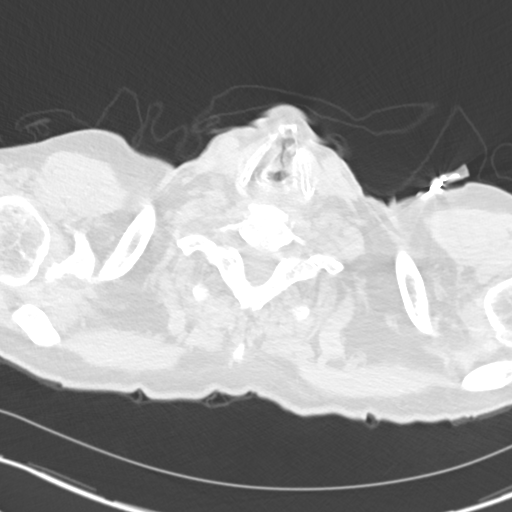

[Series 5: coronal · coronal · 0.71mm/px · 3 of 150 slices shown]
[im 30/150  lung]
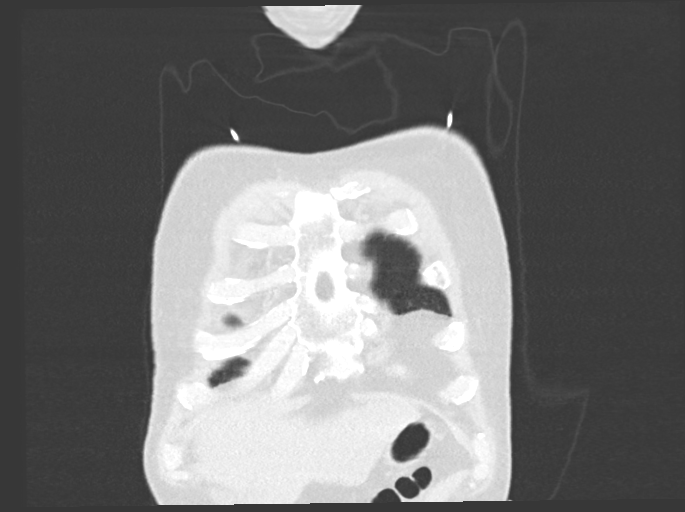
[im 60/150  lung]
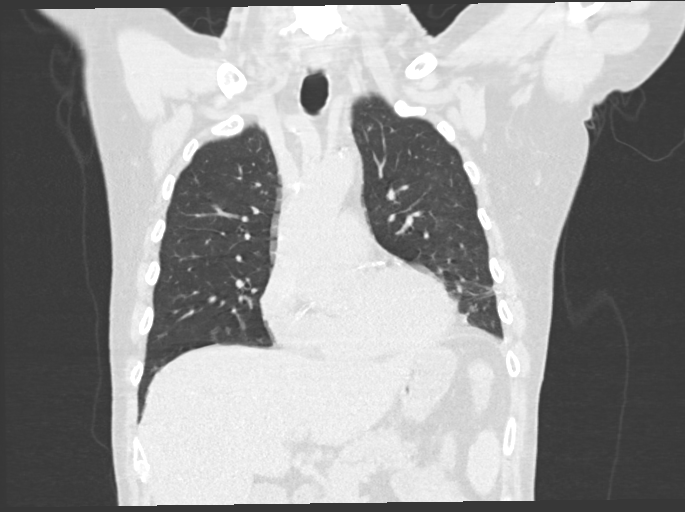
[im 90/150  lung]
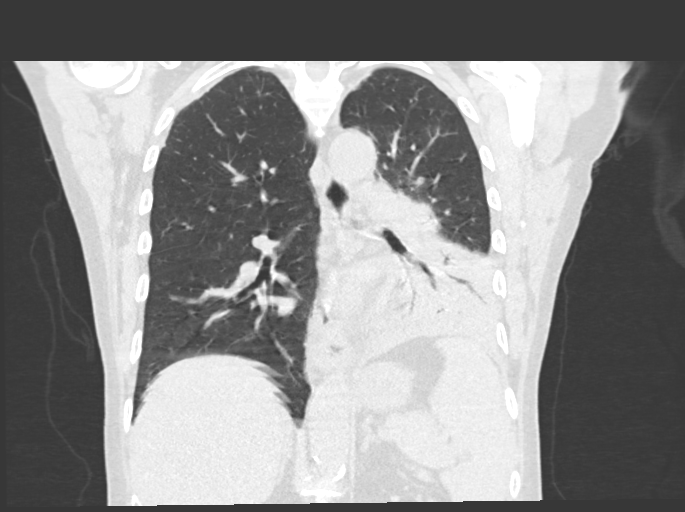

[15 of 36 positions shown; findings below may reference images not displayed]

FINDINGS: Cardiovascular: The heart is normal in size. No pericardial
effusion. There is tortuosity, mild ectasia and mild calcification
of the thoracic aorta. Branch vessel ostial calcifications are noted
along with fairly extensive three-vessel coronary artery
calcifications.

Mediastinum/Nodes: Scattered mediastinal and hilar lymph nodes
likely inflammatory/hyperplastic. No mass or overt adenopathy. The
esophagus is grossly normal. There is a small hiatal hernia noted.

Lungs/Pleura: Extensive airspace consolidation and air bronchograms
throughout the entire left lower lobe consistent with lobar
pneumonia. The left upper lobe and lingula are clear. There is a
small associated parapneumonic effusion.

The right lung is clear.  No infiltrates or effusions.

No worrisome pulmonary lesions.

Upper Abdomen: No significant upper abdominal findings. Simple left
hepatic lobe cyst noted. Moderate atherosclerotic calcifications
involving the upper abdominal aorta.

Musculoskeletal: No significant bony findings.
IMPRESSION: 1. Lobar pneumonia left lower lobe.  Small parapneumonic effusion.
2. Scattered mediastinal and hilar lymph nodes likely
hyperplastic/inflammatory.
3. The right lung is clear.  No worrisome pulmonary lesions.
4. Age advanced and fairly extensive three-vessel coronary artery
calcifications

Aortic Atherosclerosis (J6407-V52.2).

## 2020-10-16 ENCOUNTER — Encounter: Payer: Self-pay | Admitting: Internal Medicine

## 2020-10-19 ENCOUNTER — Other Ambulatory Visit
Admission: RE | Admit: 2020-10-19 | Discharge: 2020-10-19 | Disposition: A | Discharge status: Home / Self Care | Payer: Medicare Other | Source: Ambulatory Visit | Attending: Internal Medicine | Admitting: Internal Medicine

## 2020-10-19 ENCOUNTER — Other Ambulatory Visit: Payer: Self-pay

## 2020-10-19 DIAGNOSIS — Z01812 Encounter for preprocedural laboratory examination: Secondary | ICD-10-CM | POA: Insufficient documentation

## 2020-10-19 DIAGNOSIS — Z20822 Contact with and (suspected) exposure to covid-19: Secondary | ICD-10-CM | POA: Diagnosis not present

## 2020-10-19 LAB — SARS CORONAVIRUS 2 (TAT 6-24 HRS): SARS Coronavirus 2: NEGATIVE

## 2020-10-22 ENCOUNTER — Other Ambulatory Visit: Payer: Self-pay

## 2020-10-22 ENCOUNTER — Encounter
Admission: RE | Disposition: A | Discharge status: Home / Self Care | Payer: Self-pay | Source: Home / Self Care | Attending: Internal Medicine

## 2020-10-22 ENCOUNTER — Ambulatory Visit
Admission: RE | Admit: 2020-10-22 | Discharge: 2020-10-22 | Disposition: A | Discharge status: Home / Self Care | Payer: Medicare Other | Attending: Internal Medicine | Admitting: Internal Medicine

## 2020-10-22 ENCOUNTER — Ambulatory Visit: Payer: Medicare Other | Admitting: Anesthesiology

## 2020-10-22 ENCOUNTER — Encounter: Payer: Self-pay | Admitting: Internal Medicine

## 2020-10-22 DIAGNOSIS — Z87891 Personal history of nicotine dependence: Secondary | ICD-10-CM | POA: Insufficient documentation

## 2020-10-22 DIAGNOSIS — Z888 Allergy status to other drugs, medicaments and biological substances status: Secondary | ICD-10-CM | POA: Diagnosis not present

## 2020-10-22 DIAGNOSIS — Z79899 Other long term (current) drug therapy: Secondary | ICD-10-CM | POA: Insufficient documentation

## 2020-10-22 DIAGNOSIS — Z7982 Long term (current) use of aspirin: Secondary | ICD-10-CM | POA: Diagnosis not present

## 2020-10-22 DIAGNOSIS — Z1211 Encounter for screening for malignant neoplasm of colon: Secondary | ICD-10-CM | POA: Diagnosis present

## 2020-10-22 DIAGNOSIS — Z86010 Personal history of colonic polyps: Secondary | ICD-10-CM | POA: Insufficient documentation

## 2020-10-22 DIAGNOSIS — K5730 Diverticulosis of large intestine without perforation or abscess without bleeding: Secondary | ICD-10-CM | POA: Diagnosis not present

## 2020-10-22 DIAGNOSIS — K621 Rectal polyp: Secondary | ICD-10-CM | POA: Insufficient documentation

## 2020-10-22 HISTORY — DX: Cardiac arrhythmia, unspecified: I49.9

## 2020-10-22 HISTORY — PX: COLONOSCOPY: SHX5424

## 2020-10-22 HISTORY — DX: Personal history of colonic polyps: Z86.010

## 2020-10-22 SURGERY — COLONOSCOPY
Anesthesia: General

## 2020-10-22 MED ORDER — SODIUM CHLORIDE 0.9 % IV SOLN: INTRAVENOUS | Status: DC

## 2020-10-22 MED ORDER — LIDOCAINE HCL (PF) 2 % IJ SOLN
INTRAMUSCULAR | Status: AC
  Filled 2020-10-22: qty 30

## 2020-10-22 MED ORDER — PROPOFOL 10 MG/ML IV BOLUS
INTRAVENOUS | Status: DC | PRN
  Administered 2020-10-22: 20 mg via INTRAVENOUS
  Administered 2020-10-22: 30 mg via INTRAVENOUS
  Administered 2020-10-22: 20 mg via INTRAVENOUS
  Administered 2020-10-22: 10 mg via INTRAVENOUS

## 2020-10-22 MED ORDER — LIDOCAINE HCL (CARDIAC) PF 100 MG/5ML IV SOSY
PREFILLED_SYRINGE | INTRAVENOUS | Status: DC | PRN
  Administered 2020-10-22: 80 mg via INTRAVENOUS

## 2020-10-22 MED ORDER — ONDANSETRON HCL 4 MG/2ML IJ SOLN
INTRAMUSCULAR | Status: AC
  Filled 2020-10-22: qty 2

## 2020-10-22 MED ORDER — PHENYLEPHRINE HCL (PRESSORS) 10 MG/ML IV SOLN
INTRAVENOUS | Status: DC | PRN
  Administered 2020-10-22 (×3): 100 ug via INTRAVENOUS

## 2020-10-22 MED ORDER — PHENYLEPHRINE HCL (PRESSORS) 10 MG/ML IV SOLN
INTRAVENOUS | Status: AC
  Filled 2020-10-22: qty 1

## 2020-10-22 MED ORDER — GLYCOPYRROLATE 0.2 MG/ML IJ SOLN
INTRAMUSCULAR | Status: AC
  Filled 2020-10-22: qty 2

## 2020-10-22 MED ORDER — PROPOFOL 500 MG/50ML IV EMUL
INTRAVENOUS | Status: DC | PRN
  Administered 2020-10-22: 125 ug/kg/min via INTRAVENOUS

## 2020-10-22 MED ORDER — PROPOFOL 500 MG/50ML IV EMUL
INTRAVENOUS | Status: AC
  Filled 2020-10-22: qty 150

## 2020-10-22 MED ORDER — PROPOFOL 10 MG/ML IV BOLUS
INTRAVENOUS | Status: AC
  Filled 2020-10-22: qty 60

## 2020-10-22 NOTE — H&P (Signed)
Outpatient short stay form Pre-procedure 10/22/2020 8:30 AM Travis Franklin, M.D.  Primary Physician: Bethann Punches, M.D.  Reason for visit:  Personal history of colon polyps  History of present illness:                           Patient presents for colonoscopy for a personal hx of colon polyps. The patient denies abdominal pain, abnormal weight loss or rectal bleeding.      Current Facility-Administered Medications:  .  0.9 %  sodium chloride infusion, , Intravenous, Continuous, [place], Boykin Nearing, MD, Last Rate: 20 mL/hr at 10/22/20 0830, Continued from Pre-op at 10/22/20 0830  Medications Prior to Admission  Medication Sig Dispense Refill Last Dose  . aspirin EC 81 MG tablet Take 81 mg by mouth daily. Swallow whole.   Past Week at Unknown time  . loratadine (CLARITIN) 10 MG tablet Take 10 mg by mouth daily.   10/21/2020 at 0800  . pravastatin (PRAVACHOL) 40 MG tablet Take 40 mg by mouth every evening.   10/21/2020 at 0800  . rivastigmine (EXELON) 3 MG capsule Take 3 mg by mouth 2 (two) times daily.   10/21/2020 at 0800  . tamsulosin (FLOMAX) 0.4 MG CAPS capsule TK ONE C PO ONCE D. TK 30 MIN AFTER SAME MEAL EACH DAY   10/21/2020 at 1700  . thiamine (VITAMIN B-1) 100 MG tablet Take 100 mg by mouth daily.   10/21/2020 at 0800  . TURMERIC PO Take 1 tablet by mouth daily.   10/21/2020 at 0800  . amiodarone (PACERONE) 200 MG tablet Take 2 tablets (400mg ) twice daily for 1 week, then take 1 tablet (200mg ) twice daily for 1 week, then take 1 tablet (200mg ) daily after that (Patient not taking: Reported on 10/22/2020) 60 tablet 0 07/22/2020 at Unknown time  . apixaban (ELIQUIS) 5 MG TABS tablet Take 1 tablet (5 mg total) by mouth 2 (two) times daily. (Patient not taking: Reported on 10/22/2020) 120 tablet 0 07/22/2020 at Unknown time  . cefdinir (OMNICEF) 300 MG capsule Take 1 capsule (300 mg total) by mouth 2 (two) times daily. (Patient not taking: Reported on 10/22/2020) 5 capsule 0 Not Taking  at Unknown time  . cefdinir (OMNICEF) 300 MG capsule Take 1 capsule (300 mg total) by mouth 2 (two) times daily. (Patient not taking: Reported on 10/22/2020) 5 capsule 0 Not Taking at Unknown time  . ELDERBERRY PO Take 1 tablet by mouth daily. (Patient not taking: Reported on 10/22/2020)   Not Taking at Unknown time  . triamcinolone (KENALOG) 0.1 % Apply 1 application topically 2 (two) times daily. (Patient not taking: Reported on 10/22/2020)   Completed Course at Unknown time     Allergies  Allergen Reactions  . Galantamine Nausea Only     Past Medical History:  Diagnosis Date  . BPH (benign prostatic hyperplasia)   . CKD (chronic kidney disease), stage III (HCC)    baseline cr 1.5  . Dysrhythmia    PAROXYSMAL ATRIAL FIB.  10/24/2020 History of colon polyps   . Hyperlipidemia   . Hypertension     Review of systems:  Otherwise negative.    Physical Exam  Gen: Alert, oriented. Appears stated age.  HEENT: [place]/AT. PERRLA. Lungs: CTA, no wheezes. CV: RR nl S1, S2. Abd: soft, benign, no masses. BS+ Ext: No edema. Pulses 2+    Planned procedures: Proceed with colonoscopy. The patient understands the nature of the planned procedure, indications,  risks, alternatives and potential complications including but not limited to bleeding, infection, perforation, damage to internal organs and possible oversedation/side effects from anesthesia. The patient agrees and gives consent to proceed.  Please refer to procedure notes for findings, recommendations and patient disposition/instructions.     Arlie Posch K. Norma Franklin, M.D. Gastroenterology 10/22/2020  8:30 AM

## 2020-10-22 NOTE — Op Note (Signed)
[facility] Gastroenterology Patient Name: Travis Franklin Procedure Date: 10/22/2020 8:30 AM MRN: [id number] Account #: [id number] Date of Birth: [birth year] Admit Type: Outpatient Age: 69 Room: [facility] ENDO ROOM 2 Gender: Male Note Status: Finalized Procedure:             Colonoscopy Indications:           High risk colon cancer surveillance: Personal history                         of colonic polyps Providers:             Boykin Nearing. Norma Fredrickson MD, MD Referring MD:          Danella Penton, MD (Referring MD) Medicines:             Propofol per Anesthesia Complications:         No immediate complications. Estimated blood loss: None. Procedure:             Pre-Anesthesia Assessment:                        - The risks and benefits of the procedure and the                         sedation options and risks were discussed with the                         patient. All questions were answered and informed                         consent was obtained.                        - Patient identification and proposed procedure were                         verified prior to the procedure by the nurse. The                         procedure was verified in the procedure room.                        - ASA Grade Assessment: III - A patient with severe                         systemic disease.                        - After reviewing the risks and benefits, the patient                         was deemed in satisfactory condition to undergo the                         procedure.                        After obtaining informed consent, the colonoscope was                         passed under  direct vision. Throughout the procedure,                         the patient's blood pressure, pulse, and oxygen                         saturations were monitored continuously. The                         Colonoscope was introduced through the anus and                         advanced to the the cecum,  identified by appendiceal                         orifice and ileocecal valve. The colonoscopy was                         performed without difficulty. The patient tolerated                         the procedure well. The quality of the bowel                         preparation was good. The ileocecal valve, appendiceal                         orifice, and rectum were photographed. Findings:      The perianal and digital rectal examinations were normal. Pertinent       negatives include normal sphincter tone and no palpable rectal lesions.      Multiple small and large-mouthed diverticula were found in the sigmoid       colon.      A 4 mm polyp was found in the rectum. The polyp was sessile. The polyp       was removed with a jumbo cold forceps. Resection and retrieval were       complete.      A tattoo was seen in the proximal rectum. The tattoo site appeared       normal.      The exam was otherwise without abnormality on direct and retroflexion       views. Impression:            - Diverticulosis in the sigmoid colon.                        - One 4 mm polyp in the rectum, removed with a jumbo                         cold forceps. Resected and retrieved.                        - A tattoo was seen in the proximal rectum. The tattoo                         site appeared normal.                        - The examination was otherwise normal on direct and  retroflexion views. Recommendation:        - Patient has a contact number available for                         emergencies. The signs and symptoms of potential                         delayed complications were discussed with the patient.                         Return to normal activities tomorrow. Written                         discharge instructions were provided to the patient.                        - Resume previous diet.                        - Continue present medications.                        -  Await pathology results.                        - Repeat colonoscopy in 5 years for surveillance.                        - Return to GI office PRN. Procedure Code(s):     --- Professional ---                        [phone], Colonoscopy, flexible; with biopsy, single or                         multiple Diagnosis Code(s):     --- Professional ---                        K57.30, Diverticulosis of large intestine without                         perforation or abscess without bleeding                        K62.1, Rectal polyp                        Z86.010, Personal history of colonic polyps CPT copyright 2019 American Medical Association. All rights reserved. The codes documented in this report are preliminary and upon coder review may  be revised to meet current compliance requirements. Stanton Kidney MD, MD 10/22/2020 8:54:31 AM This report has been signed electronically. Number of Addenda: 0 Note Initiated On: 10/22/2020 8:30 AM Scope Withdrawal Time: 0 hours 6 minutes 11 seconds  Total Procedure Duration: 0 hours 11 minutes 12 seconds  Estimated Blood Loss:  Estimated blood loss: none. Estimated blood loss: none.      [facility]

## 2020-10-22 NOTE — Transfer of Care (Signed)
Immediate Anesthesia Transfer of Care Note  Patient: Travis Franklin  Procedure(s) Performed: COLONOSCOPY (N/A )  Patient Location: PACU and Endoscopy Unit  Anesthesia Type:General  Level of Consciousness: drowsy  Airway & Oxygen Therapy: Patient Spontanous Breathing  Post-op Assessment: Report given to RN and Post -op Vital signs reviewed and stable  Post vital signs: Reviewed and stable  Last Vitals:  Vitals Value Taken Time  BP 90/60 10/22/20 0853  Temp    Pulse 58 10/22/20 0853  Resp 13 10/22/20 0853  SpO2 99 % 10/22/20 0853  Vitals shown include unvalidated device data.  Last Pain:  Vitals:   10/22/20 0853  TempSrc:   PainSc: 0-No pain         Complications: No complications documented.

## 2020-10-22 NOTE — Anesthesia Postprocedure Evaluation (Signed)
Anesthesia Post Note  Patient: Travis Franklin  Procedure(s) Performed: COLONOSCOPY (N/A )  Patient location during evaluation: Endoscopy Anesthesia Type: General Level of consciousness: awake and alert Pain management: pain level controlled Vital Signs Assessment: post-procedure vital signs reviewed and stable Respiratory status: spontaneous breathing and respiratory function stable Cardiovascular status: stable Anesthetic complications: no   No complications documented.   Last Vitals:  Vitals:   10/22/20 0903 10/22/20 0913  BP: 103/69 112/70  Pulse: (!) 58 (!) 52  Resp: 15 17  Temp:    SpO2: 99% 100%    Last Pain:  Vitals:   10/22/20 0913  TempSrc:   PainSc: 0-No pain                 Harlym Gehling K

## 2020-10-22 NOTE — Anesthesia Procedure Notes (Signed)
Procedure Name: MAC Date/Time: 10/22/2020 8:33 AM Performed by: Lily Peer, Hanya Guerin, CRNA Pre-anesthesia Checklist: Patient identified, Emergency Drugs available, Suction available and Patient being monitored Patient Re-evaluated:Patient Re-evaluated prior to induction Oxygen Delivery Method: Nasal cannula

## 2020-10-22 NOTE — Anesthesia Preprocedure Evaluation (Signed)
Anesthesia Evaluation  Patient identified by MRN, date of birth, ID band Patient awake    Reviewed: Allergy & Precautions, NPO status , Patient's Chart, lab work & pertinent test results  History of Anesthesia Complications Negative for: history of anesthetic complications  Airway Mallampati: II       Dental   Pulmonary neg sleep apnea, neg COPD, Not current smoker, former smoker,           Cardiovascular (-) hypertension(-) Past MI and (-) CHF (-) dysrhythmias (-) Valvular Problems/Murmurs     Neuro/Psych neg Seizures    GI/Hepatic Neg liver ROS, neg GERD  ,  Endo/Other  neg diabetes  Renal/GU Renal InsufficiencyRenal disease     Musculoskeletal   Abdominal   Peds  Hematology   Anesthesia Other Findings   Reproductive/Obstetrics                            Anesthesia Physical Anesthesia Plan  ASA: II  Anesthesia Plan: General   Post-op Pain Management:    Induction: Intravenous  PONV Risk Score and Plan: 2 and Propofol infusion and TIVA  Airway Management Planned: Nasal Cannula  Additional Equipment:   Intra-op Plan:   Post-operative Plan:   Informed Consent: I have reviewed the patients History and Physical, chart, labs and discussed the procedure including the risks, benefits and alternatives for the proposed anesthesia with the patient or authorized representative who has indicated his/her understanding and acceptance.       Plan Discussed with:   Anesthesia Plan Comments:         Anesthesia Quick Evaluation

## 2020-10-22 NOTE — Interval H&P Note (Signed)
History and Physical Interval Note:  10/22/2020 8:31 AM  Travis Franklin  has presented today for surgery, with the diagnosis of HX.OF COLON POLYPS.  The various methods of treatment have been discussed with the patient and family. After consideration of risks, benefits and other options for treatment, the patient has consented to  Procedure(s): COLONOSCOPY (N/A) as a surgical intervention.  The patient's history has been reviewed, patient examined, no change in status, stable for surgery.  I have reviewed the patient's chart and labs.  Questions were answered to the patient's satisfaction.     [place], [place]

## 2020-10-23 ENCOUNTER — Encounter: Payer: Self-pay | Admitting: Internal Medicine

## 2020-10-23 LAB — SURGICAL PATHOLOGY

## 2023-05-21 ENCOUNTER — Encounter: Payer: Self-pay | Admitting: Emergency Medicine

## 2023-05-21 ENCOUNTER — Emergency Department: Payer: Medicare Other

## 2023-05-21 ENCOUNTER — Emergency Department
Admission: EM | Admit: 2023-05-21 | Discharge: 2023-05-22 | Disposition: A | Discharge status: Home / Self Care | Payer: Medicare Other | Attending: Emergency Medicine | Admitting: Emergency Medicine

## 2023-05-21 DIAGNOSIS — R110 Nausea: Secondary | ICD-10-CM | POA: Diagnosis not present

## 2023-05-21 DIAGNOSIS — R197 Diarrhea, unspecified: Secondary | ICD-10-CM | POA: Insufficient documentation

## 2023-05-21 DIAGNOSIS — R531 Weakness: Secondary | ICD-10-CM | POA: Insufficient documentation

## 2023-05-21 DIAGNOSIS — F0390 Unspecified dementia without behavioral disturbance: Secondary | ICD-10-CM | POA: Diagnosis not present

## 2023-05-21 DIAGNOSIS — E860 Dehydration: Secondary | ICD-10-CM

## 2023-05-21 DIAGNOSIS — I609 Nontraumatic subarachnoid hemorrhage, unspecified: Secondary | ICD-10-CM

## 2023-05-21 DIAGNOSIS — K8020 Calculus of gallbladder without cholecystitis without obstruction: Secondary | ICD-10-CM | POA: Diagnosis not present

## 2023-05-21 DIAGNOSIS — I700 Atherosclerosis of aorta: Secondary | ICD-10-CM | POA: Diagnosis not present

## 2023-05-21 DIAGNOSIS — K5730 Diverticulosis of large intestine without perforation or abscess without bleeding: Secondary | ICD-10-CM | POA: Diagnosis not present

## 2023-05-21 LAB — URINALYSIS, ROUTINE W REFLEX MICROSCOPIC
Bilirubin Urine: NEGATIVE
Glucose, UA: NEGATIVE mg/dL
Hgb urine dipstick: NEGATIVE
Ketones, ur: 20 mg/dL — AB
Leukocytes,Ua: NEGATIVE
Nitrite: NEGATIVE
Protein, ur: NEGATIVE mg/dL
Specific Gravity, Urine: 1.036 — ABNORMAL HIGH (ref 1.005–1.030)
pH: 6 (ref 5.0–8.0)

## 2023-05-21 LAB — COMPREHENSIVE METABOLIC PANEL
ALT: 14 U/L (ref 0–44)
AST: 20 U/L (ref 15–41)
Albumin: 4.1 g/dL (ref 3.5–5.0)
Alkaline Phosphatase: 43 U/L (ref 38–126)
Anion gap: 6 (ref 5–15)
BUN: 16 mg/dL (ref 8–23)
CO2: 23 mmol/L (ref 22–32)
Calcium: 8.9 mg/dL (ref 8.9–10.3)
Chloride: 102 mmol/L (ref 98–111)
Creatinine, Ser: 1.12 mg/dL (ref 0.61–1.24)
GFR, Estimated: >60 mL/min (ref >60)
Glucose, Bld: 135 mg/dL — ABNORMAL HIGH (ref 70–99)
Potassium: 3.7 mmol/L (ref 3.5–5.1)
Sodium: 131 mmol/L — ABNORMAL LOW (ref 135–145)
Total Bilirubin: 0.7 mg/dL (ref 0.3–1.2)
Total Protein: 7.3 g/dL (ref 6.5–8.1)

## 2023-05-21 LAB — CBC WITH DIFFERENTIAL/PLATELET
Abs Immature Granulocytes: 0.02 10*3/uL (ref 0.00–0.07)
Basophils Absolute: 0 10*3/uL (ref 0.0–0.1)
Basophils Relative: 0 %
Eosinophils Absolute: 0 10*3/uL (ref 0.0–0.5)
Eosinophils Relative: 0 %
HCT: 39.2 % (ref 39.0–52.0)
Hemoglobin: 13.4 g/dL (ref 13.0–17.0)
Immature Granulocytes: 0 %
Lymphocytes Relative: 14 %
Lymphs Abs: 1 10*3/uL (ref 0.7–4.0)
MCH: 30.9 pg (ref 26.0–34.0)
MCHC: 34.2 g/dL (ref 30.0–36.0)
MCV: 90.5 fL (ref 80.0–100.0)
Monocytes Absolute: 0.4 10*3/uL (ref 0.1–1.0)
Monocytes Relative: 6 %
Neutro Abs: 5.6 10*3/uL (ref 1.7–7.7)
Neutrophils Relative %: 80 %
Platelets: 200 10*3/uL (ref 150–400)
RBC: 4.33 MIL/uL (ref 4.22–5.81)
RDW: 11.9 % (ref 11.5–15.5)
WBC: 7 10*3/uL (ref 4.0–10.5)
nRBC: 0 % (ref 0.0–0.2)

## 2023-05-21 LAB — TSH: TSH: 3.043 u[IU]/mL (ref 0.350–4.500)

## 2023-05-21 LAB — LIPASE, BLOOD: Lipase: 41 U/L (ref 11–51)

## 2023-05-21 LAB — TROPONIN I (HIGH SENSITIVITY): Troponin I (High Sensitivity): 3 ng/L (ref <18)

## 2023-05-21 MED ORDER — LACTATED RINGERS IV BOLUS
1000.0000 mL | Freq: Once | INTRAVENOUS | Status: AC
  Administered 2023-05-21: 1000 mL via INTRAVENOUS

## 2023-05-21 MED ORDER — IOHEXOL 300 MG/ML  SOLN
100.0000 mL | Freq: Once | INTRAMUSCULAR | Status: AC | PRN
  Administered 2023-05-21: 100 mL via INTRAVENOUS

## 2023-05-21 NOTE — ED Notes (Signed)
CT @ 2300, urine, rpt temp

## 2023-05-21 NOTE — ED Triage Notes (Signed)
Pt here from home with c/o gen weakness that started today according to ems and family pt is normally active but has not had any energy

## 2023-05-21 NOTE — ED Provider Notes (Signed)
[facility] Provider Note    Event Date/Time   First MD Initiated Contact with Patient 05/21/23 1252     (approximate)   History   Weakness   HPI  Travis Franklin is a 72 y.o. male  with history of dementia, AFib presenting to the emergency department for weakness.  Accompanied by wife who provides collateral history.  She reports that over the last several months, patient will have intermittent episodes of weakness.  Felt fine this morning, but then had an episode of generalized weakness during which he was noted to be pale and sweaty.  She reports that they have seen cardiology and the patient is worn a heart monitor, but no clear source of patient's episodes has been identified.  Does feel that his episode today was the worst.  No chest pain or shortness of breath.  No syncope.  Did have some nausea with dry heaving and 1 episode of nonbloody diarrhea.      Physical Exam   Triage Vital Signs: ED Triage Vitals [05/21/23 1306]  Enc Vitals Group     BP (!) 163/101     Pulse Rate 62     Resp (!) 22     Temp      Temp src      SpO2 100 %     Weight      Height      Head Circumference      Peak Flow      Pain Score 0     Pain Loc      Pain Edu?      Excl. in GC?     Most recent vital signs: Vitals:   05/21/23 1306 05/21/23 1345  BP: (!) 163/101   Pulse: 62   Resp: (!) 22   Temp:  (!) 94.2 F (34.6 C)  SpO2: 100%      General: Awake, interactive  CV:  Regular rate, good peripheral perfusion.  Resp:  Lungs clear, unlabored respirations.  Abd:  Soft, nondistended.  Neuro:  Symmetric facial movement, slow to respond but fluent speech, generalized weakness   ED Results / Procedures / Treatments   Labs (all labs ordered are listed, but only abnormal results are displayed) Labs Reviewed  COMPREHENSIVE METABOLIC PANEL - Abnormal; Notable for the following components:      Result Value   Sodium 131 (*)    Glucose, Bld 135 (*)    All  other components within normal limits  CBC WITH DIFFERENTIAL/PLATELET  LIPASE, BLOOD  URINALYSIS, ROUTINE W REFLEX MICROSCOPIC  TSH  TROPONIN I (HIGH SENSITIVITY)     EKG EKG independently reviewed interpreted by myself (ER attending) demonstrates:  EKG demonstrates sinus rhythm at a rate of 57, PR 183, QRS 130, QTc 461, no acute ST changes  RADIOLOGY Imaging independently reviewed and interpreted by myself demonstrates:  Head CT with small SAH CT abdomen pelvis without bowel obstruction  PROCEDURES:  Critical Care performed: No  Procedures   MEDICATIONS ORDERED IN ED: Medications  iohexol (OMNIPAQUE) 300 MG/ML solution 100 mL (100 mLs Intravenous Contrast Given 05/21/23 1429)     IMPRESSION / MDM / ASSESSMENT AND PLAN / ED COURSE  I reviewed the triage vital signs and the nursing notes.  Differential diagnosis includes, but is not limited to, electrolyte abnormality, arrhythmia, anemia, UTI, acute intra-abdominal process, acute intracranial process  Patient's presentation is most consistent with acute presentation with potential threat to life or bodily function.  72 year old male  presenting with generalized weakness and episode of diaphoresis.  Hypothermic on presentation here, Bair hugger placed.  No obvious infectious source and normal heart rate and white blood cell count.  Initial labs without severe derangement.  CT abdomen pelvis without acute findings.  CT head did demonstrate a small subarachnoid hemorrhage.  This was reviewed with Dr. Myer Haff with neurosurgery.  He did feel that this was most likely traumatic in etiology but given small size, did feel that patient could have a 6-hour repeat head CT and be cleared from a neurosurgery perspective.  TSH, chest x-Damoni Causby, and repeat head CT pending.  Signed out to oncoming provider at 1600.      FINAL CLINICAL IMPRESSION(S) / ED DIAGNOSES   Final diagnoses:  Subarachnoid bleed (HCC)  Generalized weakness     Rx  / DC Orders   ED Discharge Orders     None        Note:  This document was prepared using Dragon voice recognition software and may include unintentional dictation errors.   Trinna Post, MD 05/21/23 1600

## 2023-05-21 NOTE — ED Notes (Signed)
Pt placed on Bair hugger 

## 2023-05-21 NOTE — ED Notes (Signed)
Bear hugger removed and blankets provided

## 2023-05-21 NOTE — ED Notes (Signed)
Pt returned from Ct scan , placed back on bair hugger

## 2023-05-22 NOTE — ED Provider Notes (Signed)
Patient received in signout from Dr. Derrill Kay pending repeat CT head for the ration of possible SAH seen on a previous scan.  Patient initially evaluated for generalized weakness.  Workup is generally reassuring, urine with ketones suggestive of dehydration.  Patient received a liter of LR.  Repeat CT head is benign without any evidence of ICH.  Patient is up and ambulatory, feeling well and wife agrees.  No barriers to outpatient management.  Will discharge with return precautions.   [place], MD 05/22/23 [phone]

## 2023-05-22 NOTE — ED Notes (Signed)
Ambulated patient out of room and no incidents.
